# Patient Record
Sex: Female | Born: 1960 | Race: White | Hispanic: No | Smoking: Never smoker
Health system: Southern US, Community
[De-identification: ages and names within clinical notes are randomized; demographics above are authoritative.]

## PROBLEM LIST (undated history)

## (undated) DIAGNOSIS — M1990 Unspecified osteoarthritis, unspecified site: Secondary | ICD-10-CM

## (undated) DIAGNOSIS — K529 Noninfective gastroenteritis and colitis, unspecified: Secondary | ICD-10-CM

## (undated) DIAGNOSIS — K2960 Other gastritis without bleeding: Secondary | ICD-10-CM

## (undated) DIAGNOSIS — K219 Gastro-esophageal reflux disease without esophagitis: Secondary | ICD-10-CM

## (undated) DIAGNOSIS — R112 Nausea with vomiting, unspecified: Secondary | ICD-10-CM

## (undated) DIAGNOSIS — K921 Melena: Secondary | ICD-10-CM

## (undated) DIAGNOSIS — K922 Gastrointestinal hemorrhage, unspecified: Secondary | ICD-10-CM

## (undated) DIAGNOSIS — C4491 Basal cell carcinoma of skin, unspecified: Secondary | ICD-10-CM

## (undated) DIAGNOSIS — IMO0002 Reserved for concepts with insufficient information to code with codable children: Secondary | ICD-10-CM

## (undated) DIAGNOSIS — K625 Hemorrhage of anus and rectum: Secondary | ICD-10-CM

## (undated) DIAGNOSIS — Z8489 Family history of other specified conditions: Secondary | ICD-10-CM

## (undated) DIAGNOSIS — Z9889 Other specified postprocedural states: Secondary | ICD-10-CM

## (undated) HISTORY — PX: MOHS SURGERY: SUR867

## (undated) HISTORY — PX: FRACTURE SURGERY: SHX138

## (undated) HISTORY — PX: OTHER SURGICAL HISTORY: SHX169

## (undated) HISTORY — DX: Gastrointestinal hemorrhage, unspecified: K92.2

## (undated) HISTORY — PX: ESOPHAGOGASTRODUODENOSCOPY: SHX1529

## (undated) HISTORY — PX: COLONOSCOPY: SHX174

## (undated) HISTORY — PX: BACK SURGERY: SHX140

## (undated) HISTORY — DX: Gastro-esophageal reflux disease without esophagitis: K21.9

## (undated) HISTORY — PX: KNEE ARTHROSCOPY: SHX127

## (undated) HISTORY — PX: UPPER GASTROINTESTINAL ENDOSCOPY: SHX188

---
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
## 1998-01-30 ENCOUNTER — Emergency Department (HOSPITAL_COMMUNITY): Admission: EM | Admit: 1998-01-30 | Discharge: 1998-01-30 | Payer: Self-pay | Admitting: Emergency Medicine

## 1999-01-15 ENCOUNTER — Encounter: Payer: Self-pay | Admitting: Family Medicine

## 1999-01-15 ENCOUNTER — Encounter: Admission: RE | Admit: 1999-01-15 | Discharge: 1999-01-15 | Payer: Self-pay | Admitting: Family Medicine

## 1999-11-22 ENCOUNTER — Ambulatory Visit (HOSPITAL_COMMUNITY): Admission: RE | Admit: 1999-11-22 | Discharge: 1999-11-22 | Payer: Self-pay | Admitting: Sports Medicine

## 1999-11-26 ENCOUNTER — Emergency Department (HOSPITAL_COMMUNITY): Admission: EM | Admit: 1999-11-26 | Discharge: 1999-11-26 | Payer: Self-pay

## 2000-01-29 ENCOUNTER — Encounter: Payer: Self-pay | Admitting: Obstetrics and Gynecology

## 2000-01-29 ENCOUNTER — Inpatient Hospital Stay (HOSPITAL_COMMUNITY): Admission: AD | Admit: 2000-01-29 | Discharge: 2000-01-29 | Payer: Self-pay | Admitting: Obstetrics and Gynecology

## 2000-02-22 ENCOUNTER — Ambulatory Visit: Admission: RE | Admit: 2000-02-22 | Discharge: 2000-02-22 | Payer: Self-pay | Admitting: Gynecologic Oncology

## 2001-10-31 HISTORY — PX: AUGMENTATION MAMMAPLASTY: SUR837

## 2002-01-31 HISTORY — PX: CLAVICLE SURGERY: SHX598

## 2002-09-22 ENCOUNTER — Emergency Department (HOSPITAL_COMMUNITY): Admission: EM | Admit: 2002-09-22 | Discharge: 2002-09-22 | Payer: Self-pay | Admitting: Emergency Medicine

## 2002-09-22 ENCOUNTER — Encounter: Payer: Self-pay | Admitting: Emergency Medicine

## 2003-03-07 ENCOUNTER — Encounter: Admission: RE | Admit: 2003-03-07 | Discharge: 2003-03-07 | Payer: Self-pay | Admitting: Family Medicine

## 2003-10-24 ENCOUNTER — Encounter: Admission: RE | Admit: 2003-10-24 | Discharge: 2003-10-24 | Payer: Self-pay | Admitting: Family Medicine

## 2005-12-31 HISTORY — PX: ANTERIOR CERVICAL DECOMP/DISCECTOMY FUSION: SHX1161

## 2006-01-25 ENCOUNTER — Ambulatory Visit (HOSPITAL_COMMUNITY): Admission: RE | Admit: 2006-01-25 | Discharge: 2006-01-26 | Payer: Self-pay | Admitting: Neurosurgery

## 2006-10-05 ENCOUNTER — Encounter: Admission: RE | Admit: 2006-10-05 | Discharge: 2006-10-05 | Payer: Self-pay | Admitting: Neurosurgery

## 2007-06-11 ENCOUNTER — Encounter: Payer: Self-pay | Admitting: Internal Medicine

## 2007-06-11 ENCOUNTER — Ambulatory Visit: Payer: Self-pay | Admitting: Internal Medicine

## 2007-06-11 DIAGNOSIS — IMO0001 Reserved for inherently not codable concepts without codable children: Secondary | ICD-10-CM | POA: Insufficient documentation

## 2007-06-11 DIAGNOSIS — R030 Elevated blood-pressure reading, without diagnosis of hypertension: Secondary | ICD-10-CM

## 2007-06-11 DIAGNOSIS — K219 Gastro-esophageal reflux disease without esophagitis: Secondary | ICD-10-CM | POA: Insufficient documentation

## 2007-06-29 ENCOUNTER — Ambulatory Visit: Payer: Self-pay | Admitting: Gastroenterology

## 2007-07-06 ENCOUNTER — Encounter: Admission: RE | Admit: 2007-07-06 | Discharge: 2007-07-06 | Payer: Self-pay | Admitting: Obstetrics & Gynecology

## 2007-07-16 ENCOUNTER — Ambulatory Visit: Payer: Self-pay | Admitting: Gastroenterology

## 2007-08-09 ENCOUNTER — Ambulatory Visit: Payer: Self-pay | Admitting: Internal Medicine

## 2007-08-20 ENCOUNTER — Ambulatory Visit: Payer: Self-pay | Admitting: Gastroenterology

## 2007-08-27 ENCOUNTER — Encounter: Payer: Self-pay | Admitting: Internal Medicine

## 2007-09-07 ENCOUNTER — Ambulatory Visit: Payer: Self-pay | Admitting: Pulmonary Disease

## 2007-09-10 ENCOUNTER — Telehealth (INDEPENDENT_AMBULATORY_CARE_PROVIDER_SITE_OTHER): Payer: Self-pay | Admitting: *Deleted

## 2007-09-12 ENCOUNTER — Encounter: Payer: Self-pay | Admitting: Pulmonary Disease

## 2007-09-24 ENCOUNTER — Ambulatory Visit: Payer: Self-pay | Admitting: Gastroenterology

## 2007-10-02 LAB — HM PAP SMEAR

## 2007-10-02 LAB — CONVERTED CEMR LAB: Pap Smear: NORMAL

## 2007-10-05 ENCOUNTER — Ambulatory Visit: Payer: Self-pay | Admitting: Internal Medicine

## 2007-10-05 LAB — CONVERTED CEMR LAB
Albumin: 4.3 g/dL (ref 3.5–5.2)
Alkaline Phosphatase: 48 units/L (ref 39–117)
BUN: 15 mg/dL (ref 6–23)
Bilirubin Urine: NEGATIVE
Calcium: 9.2 mg/dL (ref 8.4–10.5)
Cholesterol: 209 mg/dL (ref 0–200)
Creatinine, Ser: 0.8 mg/dL (ref 0.4–1.2)
Direct LDL: 113.5 mg/dL
Eosinophils Absolute: 0.1 10*3/uL (ref 0.0–0.7)
Eosinophils Relative: 1.4 % (ref 0.0–5.0)
GFR calc Af Amer: 99 mL/min
GFR calc non Af Amer: 82 mL/min
Glucose, Urine, Semiquant: NEGATIVE
HCT: 38 % (ref 36.0–46.0)
Ketones, urine, test strip: NEGATIVE
MCV: 99.8 fL (ref 78.0–100.0)
Monocytes Absolute: 0.3 10*3/uL (ref 0.1–1.0)
Platelets: 232 10*3/uL (ref 150–400)
Potassium: 4 meq/L (ref 3.5–5.1)
RDW: 12.1 % (ref 11.5–14.6)
Specific Gravity, Urine: 1.025
TSH: 1.19 microintl units/mL (ref 0.35–5.50)
Triglycerides: 49 mg/dL (ref 0–149)
WBC: 4.7 10*3/uL (ref 4.5–10.5)

## 2007-10-11 ENCOUNTER — Ambulatory Visit (HOSPITAL_COMMUNITY): Admission: RE | Admit: 2007-10-11 | Discharge: 2007-10-11 | Payer: Self-pay | Admitting: Obstetrics & Gynecology

## 2007-10-11 ENCOUNTER — Encounter (INDEPENDENT_AMBULATORY_CARE_PROVIDER_SITE_OTHER): Payer: Self-pay | Admitting: Obstetrics & Gynecology

## 2007-10-19 ENCOUNTER — Ambulatory Visit: Payer: Self-pay | Admitting: Internal Medicine

## 2007-11-15 ENCOUNTER — Encounter: Payer: Self-pay | Admitting: Internal Medicine

## 2008-02-11 ENCOUNTER — Ambulatory Visit: Payer: Self-pay | Admitting: Internal Medicine

## 2008-02-19 ENCOUNTER — Encounter: Payer: Self-pay | Admitting: Internal Medicine

## 2008-02-21 ENCOUNTER — Encounter: Admission: RE | Admit: 2008-02-21 | Discharge: 2008-02-21 | Payer: Self-pay | Admitting: Internal Medicine

## 2008-02-28 ENCOUNTER — Encounter: Payer: Self-pay | Admitting: Internal Medicine

## 2008-02-29 ENCOUNTER — Ambulatory Visit: Payer: Self-pay | Admitting: Internal Medicine

## 2008-02-29 ENCOUNTER — Encounter: Payer: Self-pay | Admitting: Pulmonary Disease

## 2008-07-10 ENCOUNTER — Encounter: Admission: RE | Admit: 2008-07-10 | Discharge: 2008-07-10 | Payer: Self-pay | Admitting: Obstetrics & Gynecology

## 2008-11-21 ENCOUNTER — Ambulatory Visit: Payer: Self-pay | Admitting: Gastroenterology

## 2008-11-24 ENCOUNTER — Encounter (INDEPENDENT_AMBULATORY_CARE_PROVIDER_SITE_OTHER): Payer: Self-pay | Admitting: *Deleted

## 2008-11-24 ENCOUNTER — Telehealth (INDEPENDENT_AMBULATORY_CARE_PROVIDER_SITE_OTHER): Payer: Self-pay | Admitting: *Deleted

## 2008-11-25 ENCOUNTER — Encounter: Payer: Self-pay | Admitting: Gastroenterology

## 2008-12-05 ENCOUNTER — Ambulatory Visit: Payer: Self-pay | Admitting: Internal Medicine

## 2008-12-05 LAB — CONVERTED CEMR LAB
ALT: 20 units/L (ref 0–35)
Albumin: 4.3 g/dL (ref 3.5–5.2)
Alkaline Phosphatase: 53 units/L (ref 39–117)
Basophils Relative: 1.2 % (ref 0.0–3.0)
Bilirubin, Direct: 0.1 mg/dL (ref 0.0–0.3)
CO2: 30 meq/L (ref 19–32)
Chloride: 105 meq/L (ref 96–112)
Eosinophils Absolute: 0.1 10*3/uL (ref 0.0–0.7)
Eosinophils Relative: 2.2 % (ref 0.0–5.0)
Hemoglobin: 12.9 g/dL (ref 12.0–15.0)
LDL Cholesterol: 97 mg/dL (ref 0–99)
Lymphocytes Relative: 32.6 % (ref 12.0–46.0)
MCHC: 34.1 g/dL (ref 30.0–36.0)
MCV: 102.8 fL — ABNORMAL HIGH (ref 78.0–100.0)
Monocytes Absolute: 0.2 10*3/uL (ref 0.1–1.0)
Neutro Abs: 2.2 10*3/uL (ref 1.4–7.7)
Nitrite: NEGATIVE
RBC: 3.68 M/uL — ABNORMAL LOW (ref 3.87–5.11)
Sodium: 143 meq/L (ref 135–145)
Specific Gravity, Urine: 1.015
Total CHOL/HDL Ratio: 3
Total Protein: 6.6 g/dL (ref 6.0–8.3)
WBC Urine, dipstick: NEGATIVE
WBC: 3.7 10*3/uL — ABNORMAL LOW (ref 4.5–10.5)

## 2008-12-30 ENCOUNTER — Ambulatory Visit: Payer: Self-pay | Admitting: Internal Medicine

## 2008-12-30 DIAGNOSIS — D539 Nutritional anemia, unspecified: Secondary | ICD-10-CM | POA: Insufficient documentation

## 2009-01-02 LAB — CONVERTED CEMR LAB: Vitamin B-12: 448 pg/mL (ref 211–911)

## 2009-01-31 HISTORY — PX: BRAVO PH STUDY: SHX5421

## 2009-02-05 ENCOUNTER — Ambulatory Visit: Payer: Self-pay | Admitting: Gastroenterology

## 2009-02-05 ENCOUNTER — Ambulatory Visit (HOSPITAL_COMMUNITY): Admission: RE | Admit: 2009-02-05 | Discharge: 2009-02-05 | Payer: Self-pay | Admitting: Gastroenterology

## 2009-02-09 ENCOUNTER — Telehealth: Payer: Self-pay | Admitting: Gastroenterology

## 2009-02-13 ENCOUNTER — Encounter: Payer: Self-pay | Admitting: Gastroenterology

## 2009-02-18 ENCOUNTER — Telehealth: Payer: Self-pay | Admitting: Gastroenterology

## 2009-03-12 ENCOUNTER — Encounter: Payer: Self-pay | Admitting: Gastroenterology

## 2009-05-28 ENCOUNTER — Encounter: Payer: Self-pay | Admitting: Gastroenterology

## 2009-05-28 ENCOUNTER — Encounter: Payer: Self-pay | Admitting: Internal Medicine

## 2009-05-31 HISTORY — PX: 24 HOUR PH STUDY: SHX5419

## 2009-05-31 HISTORY — PX: ESOPHAGEAL MANOMETRY: SHX1526

## 2009-06-22 ENCOUNTER — Encounter: Payer: Self-pay | Admitting: Gastroenterology

## 2009-07-20 ENCOUNTER — Telehealth: Payer: Self-pay | Admitting: Gastroenterology

## 2009-07-23 ENCOUNTER — Ambulatory Visit: Payer: Self-pay | Admitting: Family Medicine

## 2009-07-24 ENCOUNTER — Ambulatory Visit: Payer: Self-pay | Admitting: Gastroenterology

## 2009-07-31 LAB — HM MAMMOGRAPHY: HM Mammogram: NORMAL

## 2009-08-13 ENCOUNTER — Encounter: Admission: RE | Admit: 2009-08-13 | Discharge: 2009-08-13 | Payer: Self-pay | Admitting: Obstetrics & Gynecology

## 2009-08-14 ENCOUNTER — Ambulatory Visit: Payer: Self-pay | Admitting: Internal Medicine

## 2009-12-18 ENCOUNTER — Ambulatory Visit: Payer: Self-pay | Admitting: Internal Medicine

## 2009-12-18 LAB — CONVERTED CEMR LAB
ALT: 19 units/L (ref 0–35)
Alkaline Phosphatase: 39 units/L (ref 39–117)
BUN: 15 mg/dL (ref 6–23)
Basophils Relative: 1.1 % (ref 0.0–3.0)
Bilirubin Urine: NEGATIVE
Bilirubin, Direct: 0.1 mg/dL (ref 0.0–0.3)
Calcium: 9.2 mg/dL (ref 8.4–10.5)
Cholesterol: 198 mg/dL (ref 0–200)
Creatinine, Ser: 0.8 mg/dL (ref 0.4–1.2)
Eosinophils Relative: 2.3 % (ref 0.0–5.0)
GFR calc non Af Amer: 83.02 mL/min (ref >60)
HDL: 65.4 mg/dL (ref >39.00)
Ketones, urine, test strip: NEGATIVE
LDL Cholesterol: 110 mg/dL — ABNORMAL HIGH (ref 0–99)
Lymphocytes Relative: 33.6 % (ref 12.0–46.0)
MCV: 100 fL (ref 78.0–100.0)
Monocytes Absolute: 0.3 10*3/uL (ref 0.1–1.0)
Neutrophils Relative %: 54.9 % (ref 43.0–77.0)
Nitrite: NEGATIVE
Platelets: 213 10*3/uL (ref 150.0–400.0)
Protein, U semiquant: NEGATIVE
RBC: 3.77 M/uL — ABNORMAL LOW (ref 3.87–5.11)
Total Bilirubin: 0.6 mg/dL (ref 0.3–1.2)
Total CHOL/HDL Ratio: 3
Total Protein: 6.9 g/dL (ref 6.0–8.3)
Triglycerides: 111 mg/dL (ref 0.0–149.0)
Urobilinogen, UA: 0.2
VLDL: 22.2 mg/dL (ref 0.0–40.0)
WBC: 3.2 10*3/uL — ABNORMAL LOW (ref 4.5–10.5)

## 2010-01-01 ENCOUNTER — Ambulatory Visit: Payer: Self-pay | Admitting: Internal Medicine

## 2010-01-16 ENCOUNTER — Encounter: Payer: Self-pay | Admitting: *Deleted

## 2010-01-26 ENCOUNTER — Encounter (INDEPENDENT_AMBULATORY_CARE_PROVIDER_SITE_OTHER): Payer: Self-pay | Admitting: *Deleted

## 2010-02-20 ENCOUNTER — Encounter: Payer: Self-pay | Admitting: Family Medicine

## 2010-03-02 ENCOUNTER — Other Ambulatory Visit: Payer: Self-pay | Admitting: Dermatology

## 2010-03-02 NOTE — Assessment & Plan Note (Signed)
Summary: FU PER DR STAFFORD/NJR   Vital Signs:  Patient profile:   50 year old female Menstrual status:  regular Height:      66.5 inches (168.91 cm) Weight:      155.31 pounds (70.60 kg) Temp:     99.2 degrees F (37.33 degrees C) oral Pulse rate:   78 / minute BP sitting:   136 / 90  (left arm) Cuff size:   regular  Vitals Entered By: Josph Macho RMA (August 14, 2009 8:46 AM) CC: Follow-up visit per Dr Scotty Court CF Is Patient Diabetic? No   Primary Care Provider:  Birdie Sons, MD  CC:  Follow-up visit per Dr Scotty Court CF.  History of Present Illness: Reviewed Dr.  Charmian Muff note she did not take the benicar very often BPs  have now normalized of of all BP meds When bp was elevated she felt generally unwell: fatigue headach she now feels well  All other systems reviewed and were negative   Current Medications (verified): 1)  Zegerid 40-1100 Mg  Caps (Omeprazole-Sodium Bicarbonate) .... One A Day Before Meals 2)  Viactiv Multi-Vitamin   Chew (Multiple Vitamins-Calcium) .... Two Times A Day 3)  Junel Fe 1/20 1-20 Mg-Mcg Tabs (Norethin Ace-Eth Estrad-Fe)  Allergies (verified): 1)  ! * Narcotics  Physical Exam  General:  Well-developed,well-nourished,in no acute distress; alert,appropriate and cooperative throughout examination Neck:  no JVD, thyromegaly, or lymphadenopathy. Lungs:  Normal respiratory effort, chest expands symmetrically. Lungs are clear to auscultation, no crackles or wheezes. Heart:  Normal rate and regular rhythm. S1 and S2 normal without gallop, murmur, click, rub or other extra sounds.   Impression & Recommendations:  Problem # 1:  HYPERTENSION (ICD-401.9) resolved no further evaluation The following medications were removed from the medication list:    Benicar 20 Mg Tabs (Olmesartan medoxomil) .Marland Kitchen... 1 stat then one q.d. cough---she will try claritin  Complete Medication List: 1)  Zegerid 40-1100 Mg Caps (Omeprazole-sodium bicarbonate)  .... One a day before meals 2)  Viactiv Multi-vitamin Chew (Multiple vitamins-calcium) .... Two times a day 3)  Junel Fe 1/20 1-20 Mg-mcg Tabs (Norethin ace-eth estrad-fe)

## 2010-03-02 NOTE — Procedures (Signed)
Summary: Esophageal Manometry, Esophageal pH Study/[facility]  Esophageal Manometry, Esophageal pH Study/[facility]   Imported By: Maryln Gottron 06/23/2009 10:21:51  _____________________________________________________________________  External Attachment:    Type:   Image     Comment:   External Document

## 2010-03-02 NOTE — Procedures (Signed)
Summary: BRAVO pH/[facility]  BRAVO pH/ Gastroenterology   Imported By: Lester Patillas 03/06/2009 09:52:22  _____________________________________________________________________  External Attachment:    Type:   Image     Comment:   External Document

## 2010-03-02 NOTE — Miscellaneous (Signed)
Summary: Orders Update/Dr Alycia Rossetti  Clinical Lists Changes  Orders: Added new Test order of [facility]) - Signed

## 2010-03-02 NOTE — Procedures (Signed)
Summary: EGD   EGD  Procedure date:  07/16/2007  Findings:      Location: [facility]    EGD  Procedure date:  07/16/2007  Findings:      Location: [facility]   Patient Name: Jacqueline Molina, Jacqueline Molina MRN:  Procedure Procedures: Panendoscopy (EGD) CPT: 43235.    with biopsy(s)/brushing(s). CPT: D1846139.  Personnel: Endoscopist: Rachael Fee, MD.  Referred By: Valetta Mole Swords, MD.  Exam Location: Exam performed in Endoscopy Suite. Outpatient  Patient Consent: Procedure, Alternatives, Risks and Benefits discussed, consent obtained, from patient. Consent was obtained by the RN.  Indications Symptoms: Dysphagia. COUGH FOR 10 YEARS, NO CLASSIC GERD SYMPTOMS (PYROSIS, ACID REGURGE), +INTERMITTENT SOLID FOOD DYSPHAGIA (RECENTLY), TAKES BID PREVACID, TAKES MULITPLE COUGH DROPS DAILY.  HAS NEVER SEEN PULMONOLOGIST.  History  Current Medications: Patient is not currently taking Coumadin.  Comments: Patient history reviewed and updated, pre-procedure physical performed prior to initiation of sedation? YES Pre-Exam Physical: Performed Jul 16, 2007  Cardio-pulmonary exam, Abdominal exam, Mental status exam WNL.  Exam Exam Info: Maximum depth of insertion Duodenum, intended Duodenum. Patient position: on left side. Gastric retroflexion performed. Images taken. ASA Classification: II. Tolerance: good.  Sedation Meds: Patient assessed and found to be appropriate for moderate (conscious) sedation. Fentanyl 100 mcg. given IV. Versed 9 mg. given IV.  Monitoring: BP and pulse monitoring done. Oximetry used. Supplemental O2 given  Findings - Normal: Proximal Esophagus to Duodenal 2nd Portion. Comments: OTHERWISE NORMAL EXAMINATION.  - MUCOSAL ABNORMALITY: Antrum. RUT done, results pending. Comment: SEVERAL SMALL GASTRIC EROSIONS IN DISTAL STOMACH, MOST CONSISTENT WITH NSAID CHANGES.  CLO BIOPSIES TAKEN TO CHECK FOR H. PYLORI.   Assessment Abnormal  examination, see findings above.  Comments: DISTAL GASTRIC EROSIONS, MOST CONSISTENT WITH NSAID DAMAGE.  CLO TESTING PERFORMED AND IF POSITIVE WILL ERADICATE H. PYLORI WITH ANTIBIOTICS.  THESE FINDINGS DO NOT EXPLAIN HER SYMPTOMS.  SHE HAS NO BARRETT'S, ESOPHAGITIS, PEPTIC STRICTURING IN ESOPHAGUS.  SHE HAS NO CLASSIC GERD SYMPTOMS (PYROSIS, ACID REGURGITATION, WATERBRASH) AND SO IT IS NOT CLEAR THAT GERD IS CAUSING HER CHRONIC COUGH.   SHE WILL BE SCHEDULED FOR AN OFFICE VISIT WITH ME IN THE NEXT 2-3 WEEKS TO DISCUSS HER CHRONIC SYMPTOMS AND CONSIDER FURTHER WORKUP.  Events  Unplanned Intervention: No unplanned interventions were required.  Unplanned Events: There were no complications. Plans Comments: NEEDS NEW GI OFFICE VISIT WITH ME IN NEXT 2-3 WEEKS.

## 2010-03-02 NOTE — Procedures (Signed)
Summary: Esophageal [facility]  Esophageal [facility]   Imported By: Sherian Rein 06/18/2009 13:34:42  _____________________________________________________________________  External Attachment:    Type:   Image     Comment:   External Document

## 2010-03-02 NOTE — Progress Notes (Signed)
Summary: triage  Phone Note Call from Patient Call back at Work Phone [phone] Call back at [phone]  (cell)   Caller: Patient Call For: Dr. Christella Hartigan Reason for Call: Talk to Nurse Summary of Call: pt reporting a tightness in her chest (between her breasts specifically) after swallowing food... actual act of swallowing is without difficulty... wanted to speak with a nurse/CMA before schl'ing Initial call taken by: Vallarie Mare,  February 09, 2009 10:19 AM  Follow-up for Phone Call        left message on machine to call back  Follow-up by: Chales Abrahams CMA Duncan Dull),  February 09, 2009 10:55 AM  Additional Follow-up for Phone Call Additional follow up Details #1::        pt has pain when swallowing food only.  After the food passes the pain goes away.  She is chewing her food really well. She had Bravo placed on Thursday.  This pain started on Sat.  She has a tight feeling even when not eating or drinking.  Even liquids cause some pain.  No trouble breathing.  Dr Christella Hartigan is off today I will send this to Dr Leone Payor who is Doc of the day.  Please Advise Dr Leone Payor. Chales Abrahams CMA Duncan Dull)  February 09, 2009 11:01 AM     Additional Follow-up for Phone Call Additional follow up Details #2::    Restart Zegerid two times a day liquid to soft diet, ovoid very hot, very coldm, spicy, citrus/acidic foods call back if fever, vomiting blood, worse expect this to improve and resolve over next few days and can advance diet as toleraated merge charts as you plan to and cc Dr. Christella Hartigan also Follow-up by: Iva Boop MD, Clementeen Graham,  February 09, 2009 11:17 AM  Additional Follow-up for Phone Call Additional follow up Details #3:: Details for Additional Follow-up Action Taken: Pt given Dr Marvell Fuller recommendations and told to call us on Friday for a condition update. Additional Follow-up by: Chales Abrahams CMA Duncan Dull),  February 09, 2009 11:36 AM   Appended Document: triage ok  Appended Document: triage she  is feeling better, nearly back to normal.  i suspect she was feeling the bravo probe or irritation in esophagus where probe had attatched.  either way she is better.   her pH test showed elevated acid levels (demeester was above normal) and also very good correlation between her coughing, belching and acid reflux events.  She notices small improvement on twice daily PPI.  I wonder about a component of non-acid reflux also causing her symptoms.  I think she MAY benefit from fundoplication but would like to get another opinion on this from Dr. Claire Shown at [facility].   Alexia Freestone can you please arrange referral to Dr. Alycia Rossetti, ? is would she benefit from fundoplication in his opinion.  Send copies of EGD, recent office visit, also the Bravo tracings (on my desk).  thanks  Appended Document: triage records faxed to Dr Alycia Rossetti for appt

## 2010-03-02 NOTE — Assessment & Plan Note (Signed)
  Review of gastrointestinal problems: 1. Chronic cough.  ? GERD related.  Very rarely has classic GERD symptoms. EGD 07/2007 showed normal esophagus, minor H. pylori negative gastritis.  ENT eval suggested LE reflux, symptoms improved with zegeride.  Pulmonary evaluation suggests no asthma, intrinsic pulmonary causes for cough.  2011 evaluation at [facility] gastroenterology Department including impedance testing concluded that her cough is not likely related to gastroesophageal reflux disease.   History of Present Illness Visit Type: Follow-up Visit Primary GI MD: Rob Bunting MD Primary Provider: Birdie Sons, MD Chief Complaint: [facility] f/u History of Present Illness:     very pleasant 50 year old woman who [facility]after her [facility] at [facility] gastroenterology Department for chronic cough.  Extensive testing was done there including repeating pH study, impedance testing. There conclusion after all the tests was that her cough was would not related to gastroesophageal reflux.  She is very clear today and has always been clear that stressful situations tend to bring on the cough.  yesterday at work, her BP was quite high (160/110), and was put on BP med benicar.  she has not had any cough since then.  She has been very "drugged feeling, woozy"             Current Medications (verified): 1)  Zegerid 40-1100 Mg  Caps (Omeprazole-Sodium Bicarbonate) .... Two Times A Day Before Meals 2)  Viactiv Multi-Vitamin   Chew (Multiple Vitamins-Calcium) .... Two Times A Day 3)  Benicar 20 Mg Tabs (Olmesartan Medoxomil) .Marland Kitchen.. 1 Stat Then One Q.d. 4)  Junel Birth Control Pill .... As Directed  Allergies (verified): 1)  ! * Narcotics  Vital Signs:  Patient profile:   50 year old female Menstrual status:  regular Height:      66.5 inches Weight:      157 pounds BMI:     25.05 Pulse rate:   96 / minute Pulse rhythm:   regular BP sitting:   158 / 86  (left  arm)  Vitals Entered By: Chales Abrahams CMA Duncan Dull) (July 24, 2009 10:51 AM)  Physical Exam  Additional Exam:  Constitutional: generally well appearing Psychiatric: alert and oriented times 3 Abdomen: soft, non-tender, non-distended, normal bowel sounds    Impression & Recommendations:  Problem # 1:  Cough it is not seen this is GI related. I recommended she followup again with her primary care physician to discuss any further referrals. Stress seems to bring on most reliably. I suggested to her that she consider antianxiety medicine however she is not very excited about that, she is afraid she'll be too drowsy from even small dose.  Patient Instructions: 1)  Follow up with Dr. Cato Mulligan to consider other referral for cough (allergist?). 2)  Several tests (in  and at [facility]) point away from acid playing a signficant role in your cough. 3)  Start decreasing you PPI (zegeride) to once a day for 1-2 months, then try off completley. 4)  The medication list was reviewed and reconciled.  All changed / newly prescribed medications were explained.  A complete medication list was provided to the patient / caregiver.

## 2010-03-02 NOTE — Procedures (Signed)
Summary: Esophageal Function test/[facility]  Esophageal Function test/[facility]   Imported By: Maryln Gottron 06/23/2009 10:37:38  _____________________________________________________________________  External Attachment:    Type:   Image     Comment:   External Document

## 2010-03-02 NOTE — Progress Notes (Signed)
Summary: Next step?  Phone Note Call from Patient Call back at Work Phone [phone]   Call For: DR Christella Hartigan Reason for Call: Talk to Nurse Summary of Call: Had test done at [facility] and is looking for what is the next step.  Initial call taken by: Leanor Kail [facility],  July 20, 2009 12:51 PM  Follow-up for Phone Call        rov with me in next few weeks to discuss Follow-up by: Rachael Fee MD,  July 21, 2009 7:58 AM  Additional Follow-up for Phone Call Additional follow up Details #1::        pt aware appt made Additional Follow-up by: Chales Abrahams CMA Duncan Dull),  July 22, 2009 8:42 AM

## 2010-03-02 NOTE — Procedures (Signed)
Summary: Esophageal pH Study[facility]  Esophageal pH Study/[facility]   Imported By: Sherian Rein 06/18/2009 13:33:36  _____________________________________________________________________  External Attachment:    Type:   Image     Comment:   External Document

## 2010-03-02 NOTE — Procedures (Signed)
Summary: Esophageal Manometry/[facility]  [facility]   Imported By: Sherian Rein 06/30/2009 09:17:18  _____________________________________________________________________  External Attachment:    Type:   Image     Comment:   External Document

## 2010-03-02 NOTE — Procedures (Signed)
Summary: Upper Endoscopy  Patient: Jacqueline Molina Note: All result statuses are Final unless otherwise noted.  Tests: (1) Upper Endoscopy (EGD)   EGD Upper Endoscopy       DONE     [facility]     [address] [place], [place]  [address]           ENDOSCOPY PROCEDURE REPORT           PATIENT:  [name], [name]  MR#:  [id number]     BIRTHDATE:  [birth year], 48 yrs. old  GENDER:  female     ENDOSCOPIST:  Rachael Fee, MD     PROCEDURE DATE:  02/05/2009     PROCEDURE:  Bravo pH probe placement     ASA CLASS:  Class II     INDICATIONS:  chronic cough, ? GERD related. This pH test is being     done OFF antiacid medicines.     MEDICATIONS:  Fentanyl 70 mcg IV, Versed 7 mg IV     TOPICAL ANESTHETIC:  none     DESCRIPTION OF PROCEDURE:   After the risks benefits and     alternatives of the procedure were thoroughly explained, informed     consent was obtained.  The  endoscope was introduced through the     mouth and advanced to the second portion of the duodenum, without     limitations.  The instrument was slowly withdrawn as the mucosa     was fully examined.     <<PROCEDUREIMAGES>>     The upper, middle, and distal third of the esophagus were     carefully inspected and no abnormalities were noted. The z-line     was well seen at the GEJ. The endoscope was pushed into the fundus     which was normal including a retroflexed view. The antrum,gastric     body, first and second part of the duodenum were unremarkable. GE     junction located at 43cm from incisors. Bravo pH probe was placed     in usual fashion at 37cm from incisors and position was verified     endoscopicall following placement (see image1, image2, image3,     image5, and image7).    Retroflexed views revealed no     abnormalities.    The scope was then withdrawn from the patient     and the procedure completed.     COMPLICATIONS:  None           ENDOSCOPIC IMPRESSION:     1) Normal EGD.  Bravo pH  probe placed 6cm above GE junction.           RECOMMENDATIONS:     Await Bravo pH testing results.  She has noticed that her cough     has probably been worse since stopping Zegeride one week ago for     this test.  Perhaps evidence that acid is playing a role.  She can     resume the Zegeride AFTER the test is completed.           ______________________________     Rachael Fee, MD           cc: Birdie Sons, MD; Marcelyn Bruins, MD           n.     eSIGNED:   Rachael Fee at 02/05/2009 08:33 AM           Leticia Penna, [id number]  Note: An exclamation mark (!) indicates a result that was not dispersed into the flowsheet. Document Creation Date: 02/05/2009 8:33 AM _______________________________________________________________________  (1) Order result status: Final Collection or observation date-time: 02/05/2009 08:27 Requested date-time:  Receipt date-time:  Reported date-time:  Referring Physician:   Ordering Physician: Rob Bunting [phone]) Specimen Source:  Source: Launa Grill Order Number: [id number] Lab site:

## 2010-03-02 NOTE — Assessment & Plan Note (Signed)
Summary: cpx/no pap/cjr   Vital Signs:  Patient profile:   50 year old female Menstrual status:  regular Height:      66.5 inches Weight:      160 pounds Temp:     99.2 degrees F oral Pulse rate:   100 / minute Pulse rhythm:   regular BP sitting:   144 / 94  (left arm) Cuff size:   regular  Vitals Entered By: Alfred Levins, CMA (January 01, 2010 8:13 AM) CC: cpx, no pap   CC:  cpx and no pap.  Allergies: 1)  ! * Narcotics  Physical Exam  General:  well-developed well-nourished female in no acute distress. HEENT exam atraumatic, normocephalic symmetric her muscles are intact. Neck is supple.she is an anterior cervical scar. Chest clear to auscultation without increased work of breathing. Cardiac exam S1-S2 are regular without murmurs gals her abdominal exam active bowel sounds, soft and nontender. Extremities no clubbing cyanosis or edema. Neurologic exam is alert and oriented without any motor or sensory deficits.   Impression & Recommendations:  Problem # 1:  PREVENTIVE HEALTH CARE (ICD-V70.0) she's in excellent health. She exercises regularly. I've asked her to monitor her blood pressure at home. We'll schedule her for colonoscopy. Immunizations are up-to-date. Orders: Gastroenterology Referral (GI)  Complete Medication List: 1)  Zegerid 40-1100 Mg Caps (Omeprazole-sodium bicarbonate) .... One a day before meals 2)  Viactiv Multi-vitamin Chew (Multiple vitamins-calcium) .... Two times a day 3)  Camila 0.35 Mg Tabs (Norethindrone) .Marland Kitchen.. 1 by mouth once daily  Preventive Care Screening  Mammogram:    Date:  07/31/2009    Next Due:  08/2010    Results:  normal   Prescriptions: ZEGERID 40-1100 MG  CAPS (OMEPRAZOLE-SODIUM BICARBONATE) one a day before meals  #90 x 3   Entered and Authorized by:   Birdie Sons MD   Signed by:   Birdie Sons MD on 01/01/2010   Method used:   Electronically to        [facility] [phone]* (retail)       [address]  [place], [place]  [address]       Ph: [phone] or [phone]       Fax: [phone]   RxID:   [id number] ZEGERID 40-1100 MG  CAPS (OMEPRAZOLE-SODIUM BICARBONATE) one a day before meals  #30 x 11   Entered by:   Alfred Levins, CMA   Authorized by:   Birdie Sons MD   Signed by:   Alfred Levins, CMA on 01/01/2010   Method used:   Electronically to        [facility] [phone][facility] (retail)       [address]       [place], [place]  [address]       Ph: [phone] or [phone]       Fax: [phone]   RxID:   [id number]    Orders Added: 1)  Gastroenterology Referral [GI] 2)  Est. Patient 40-64 years [[address]]   Immunization History:  Influenza Immunization History:    Influenza:  fluvax 3+ (12/01/2009)   Immunization History:  Influenza Immunization History:    Influenza:  Fluvax 3+ (12/01/2009)  Preventive Care Screening  Mammogram:    Date:  07/31/2009    Next Due:  08/2010    Results:  normal

## 2010-03-02 NOTE — Progress Notes (Signed)
Summary: ? re appt   Phone Note Call from Patient Call back at Work Phone [phone]   Caller: Patient Call For: Christella Hartigan Reason for Call: Talk to Nurse Summary of Call: Patient wants to know the status of her appt at [facility], states that she spoke to Dr Christella Hartigan on Friday and was told that she was going to get a call this week but no one has called her yet Initial call taken by: Tawni Levy,  February 18, 2009 8:42 AM  Follow-up for Phone Call        appt  03/04/09 1 pm Dr Abelina Bachelor. Pt aware.  Marilynne Drivers will send out new pt paperwork. Follow-up by: Chales Abrahams CMA Duncan Dull),  February 18, 2009 9:06 AM

## 2010-03-02 NOTE — Procedures (Signed)
Summary: Esophageal pH Study/[facility]  Esophageal pH Study/[facility]   Imported By: Sherian Rein 06/30/2009 09:15:23  _____________________________________________________________________  External Attachment:    Type:   Image     Comment:   External Document

## 2010-03-02 NOTE — Consult Note (Signed)
Summary: GI/[facility]  GI/[facility]   Imported By: Sherian Rein 07/06/2009 14:10:25  _____________________________________________________________________  External Attachment:    Type:   Image     Comment:   External Document

## 2010-03-02 NOTE — Assessment & Plan Note (Signed)
Summary: elevated BP, OK Gina/nn   Vital Signs:  Patient profile:   50 year old female Menstrual status:  regular Weight:      159 pounds O2 Sat:      99 % Temp:     99.1 degrees F Pulse rate:   86 / minute Pulse rhythm:   regular BP sitting:   164 / 100  (left arm)  Vitals Entered By: Pura Spice, RN (July 23, 2009 4:03 PM) CC: been ck bp today and has been elevated stated headache yest.    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 .Today at work her blood pressure was taken blood pressure 154//99 149 94at 3 PM today of 169/109 at 1 PM 159/105 She has no headache today no dizziness, no edema review of systems is completely negative except for the hypertension When questioned about her muscle cycle and that she was started on birth control pills 3 months previously Patient has had considerable problem over the past 5-6 months regarding esophagitis with esophageal reflux also cause a cough,she is under the care of Dr. Gerilyn Pilgrim, has had multiple failures with PPIs  Allergies: 1)  ! * Narcotics  Past History:  Past Medical History: Last updated: 10/19/2007 GERD/gastritis (has had EGD---multiple PPI failures) Hypertension  Past Surgical History: Last updated: 06/11/2007 neck fusion C5-C6-C7  12/07 breast augmentation  2001-06-16 bone graft collarbone  Jun 17, 2002 right knee surgery for bone spur  age 50 fibroid removal  Social History: Last updated: 11-26-08 husband died from esophageal cancer 06-16-2008 1 dtr healthy, four stepdaughters Alcohol use-yes, socially on weekends. Regular exercise--yes works as a Sales executive Never smoker  Risk Factors: Smoking Status: never (12/30/2008)  Review of Systems      See HPI General:  Complains of malaise. Eyes:  Denies blurring, discharge, double  vision, eye irritation, eye pain, halos, itching, light sensitivity, red eye, vision loss-1 eye, and vision loss-both eyes. ENT:  Denies decreased hearing, difficulty swallowing, ear discharge, earache, hoarseness, nasal congestion, nosebleeds, postnasal drainage, ringing in ears, sinus pressure, and sore throat. CV:  See HPI; elevated blood pressure. Resp:  Denies chest discomfort, chest pain with inspiration, cough, coughing up blood, excessive snoring, hypersomnolence, morning headaches, pleuritic, shortness of breath, sputum productive, and wheezing. GI:  has been endoscoped several times since the first of the year for esophagitis reflux which also caused a cough. GU:  Denies abnormal vaginal bleeding, decreased libido, discharge, dysuria, genital sores, hematuria, incontinence, nocturia, urinary frequency, and urinary hesitancy. MS:  Denies joint pain, joint redness, joint swelling, loss of strength, low back pain, mid back pain, muscle aches, muscle , cramps, muscle weakness, stiffness, and thoracic pain; bone graft to left clavicle.  Physical Exam  General:  Well-developed,well-nourished,in no acute distress; alert,appropriate and cooperative throughout examination Head:  Normocephalic and atraumatic without obvious abnormalities. No apparent alopecia or balding. Eyes:  No corneal or conjunctival inflammation noted. EOMI. Perrla. Funduscopic exam benign, without hemorrhages, exudates or papilledema. Vision grossly normal. Ears:  External ear exam shows no significant lesions or deformities.  Otoscopic examination reveals clear canals, tympanic membranes are intact bilaterally without bulging, retraction, inflammation or discharge. Hearing is grossly normal bilaterally. Nose:  External nasal examination shows no deformity or inflammation. Nasal mucosa are pink and moist without lesions or exudates. Mouth:  Oral mucosa  and oropharynx without lesions or exudates.  Teeth in good repair. Lungs:   Normal respiratory effort, chest expands symmetrically. Lungs are clear to auscultation, no crackles or wheezes. Heart:  Normal rate and regular rhythm. S1 and S2 normal without gallop, murmur, click, rub or other extra sounds. Abdomen:  Bowel sounds positive,abdomen soft and non-tender without masses, organomegaly or hernias noted. Rectal:  not examined Genitalia:  not examined Msk:  operative scar on the left clavicle bone graft Extremities:  no edema Neurologic:  No cranial nerve deficits noted. Station and gait are normal. Plantar reflexes are down-going bilaterally. DTRs are symmetrical throughout. Sensory, motor and coordinative functions appear intact.   Impression & Recommendations:  Problem # 1:  HYPERTENSION (ICD-401.9) Assessment Deteriorated  Her updated medication list for this problem includes:    Benicar 20 Mg Tabs (Olmesartan medoxomil) .Marland Kitchen... 1 stat then one q.d. 2 return in one week to see Dr. Timoteo Gaul regarding further studies and to evaluate the effect of the birth control pill  Problem # 2:  COUGH, CHRONIC (ICD-786.2) Assessment: Unchanged  Problem # 3:  GERD (ICD-530.81) Assessment: Unchanged  Her updated medication list for this problem includes:    Zegerid 40-1100 Mg Caps (Omeprazole-sodium bicarbonate) .Marland Kitchen..Marland Kitchen Two times a day before meals  Complete Medication List: 1)  Zegerid 40-1100 Mg Caps (Omeprazole-sodium bicarbonate) .... Two times a day before meals 2)  Viactiv Multi-vitamin Chew (Multiple vitamins-calcium) .... Two times a day 3)  Benicar 20 Mg Tabs (Olmesartan medoxomil) .Marland Kitchen.. 1 stat then one q.d.  Patient Instructions: 1)  Said she had some elevation of blood pressure in the past but then has been controlled and with her symptoms and elevated blood pressure today on numerous readings I feel that he would be well to start Benicar 20 mg q.d. to make an appointment with Dr. Cato Mulligan 2)  In the next week for him to evaluate further studies and also to help  determine whether the birth control pills have some affect on her blood pressure 3)  Recommend no strenuous activity is until seen by Dr.Swords

## 2010-03-02 NOTE — Consult Note (Signed)
Summary: Gastroenterology/[facility]  Gastroenterology/[facility]   Imported By: Sherian Rein 04/08/2009 13:58:14  _____________________________________________________________________  External Attachment:    Type:   Image     Comment:   External Document

## 2010-03-03 ENCOUNTER — Encounter (INDEPENDENT_AMBULATORY_CARE_PROVIDER_SITE_OTHER): Payer: Self-pay | Admitting: *Deleted

## 2010-03-04 ENCOUNTER — Ambulatory Visit: Admit: 2010-03-04 | Payer: Self-pay | Admitting: Gastroenterology

## 2010-03-04 ENCOUNTER — Encounter: Payer: Self-pay | Admitting: Gastroenterology

## 2010-03-04 ENCOUNTER — Telehealth: Payer: Self-pay | Admitting: Gastroenterology

## 2010-03-04 NOTE — Letter (Signed)
Summary: Pre Visit Letter Revised  [facility]  [address] [place], [place] [address]   Phone: [phone]  Fax: [phone]        01/26/2010 MRN: [id number] [facility] [address] [place], [place]  [address]             Procedure Date:  03-19-10   Welcome to the Gastroenterology Division at [facility].    You are scheduled to see a nurse for your pre-procedure visit on 03-04-10 at 2:30p.m. on the 3rd floor at [facility], [address]  We ask that you try to arrive at our office 15 minutes prior to your appointment time to allow for check-in.  Please take a minute to review the attached form.  If you answer "Yes" to one or more of the questions on the first page, we ask that you call the person listed at your earliest opportunity.  If you answer "No" to all of the questions, please complete the rest of the form and bring it to your appointment.    Your nurse visit will consist of discussing your medical and surgical history, your immediate family medical history, and your medications.   If you are unable to list all of your medications on the form, please bring the medication bottles to your appointment and we will list them.  We will need to be aware of both prescribed and over the counter drugs.  We will need to know exact dosage information as well.    Please be prepared to read and sign documents such as consent forms, a financial agreement, and acknowledgement forms.  If necessary, and with your consent, a friend or relative is welcome to sit-in on the nurse visit with you.  Please bring your insurance card so that we may make a copy of it.  If your insurance requires a referral to see a specialist, please bring your referral form from your primary care physician.  No co-pay is required for this nurse visit.     If you cannot keep your appointment, please call [phone] to cancel or reschedule prior to your appointment date.  This  allows [place] the opportunity to schedule an appointment for another patient in need of care.    Thank you for choosing [facility] for your medical needs.  We appreciate the opportunity to care for you.  Please visit [place] at our website  to learn more about our practice.  Sincerely, The Gastroenterology Division

## 2010-03-10 NOTE — Progress Notes (Signed)
Summary: ?EGD  Phone Note Outgoing Call   Call placed by: Karl Bales RN,  March 04, 2010 3:02 PM Summary of Call: Dr. Christella Hartigan, Jacqueline Molina was in for her previsit today; she has a screening colonoscopy on 03-19-10.  She is still taking her Zegerid for her GERD but states that she notices that her voice has changed and is more "raspy."  She was wondering if you felt she needs to have an EGD as well.  Her deductible starts over in March so she wanted to double check.   Thank you, Kristen Initial call taken by: Karl Bales RN,  March 04, 2010 3:04 PM  Follow-up for Phone Call        no, her voice/cough have been extensively evaluated by me and ENT and [facility]...does not appear acid related.  would go ahead with colonoscopy alone Follow-up by: Rachael Fee MD,  March 04, 2010 3:13 PM  Additional Follow-up for Phone Call Additional follow up Details #1::        Ok, thank you very much Additional Follow-up by: Karl Bales RN,  March 04, 2010 3:51 PM    Additional Follow-up for Phone Call Additional follow up Details #2::    Pt called and told of Dr. Christella Hartigan recommendation to do colonoscopy alone. Follow-up by: Karl Bales RN,  March 04, 2010 3:51 PM

## 2010-03-10 NOTE — Miscellaneous (Signed)
Summary: [facility] previsit  Clinical Lists Changes  Medications: Added new medication of MOVIPREP 100 GM  SOLR (PEG-KCL-NACL-NASULF-NA ASC-C) As per prep instructions. - Signed Rx of MOVIPREP 100 GM  SOLR (PEG-KCL-NACL-NASULF-NA ASC-C) As per prep instructions.;  #1 x 0;  Signed;  Entered by: Karl Bales RN;  Authorized by: Rachael Fee MD;  Method used: Electron[facility] to [facility] [phone]*, [address] [place], [place]  [address], Ph: [phone] or [phone], Fax: [phone]    Prescriptions: MOVIPREP 100 GM  SOLR (PEG-KCL-NACL-NASULF-NA ASC-C) As per prep instructions.  #1 x 0   Entered by:   Karl Bales RN   Authorized by:   Rachael Fee MD   Signed by:   Karl Bales RN on 03/04/2010   Method used:   [facility] to        [phone] (retail)       [address]       [place], [place]  [address]       Ph: [phone] or [phone]       Fax: [phone]   RxID:   [id number]

## 2010-03-10 NOTE — Letter (Signed)
Summary: [facility] Instructions  [facility]  [address] [place], [place] [address]   Phone: [phone]  Fax: [phone]       [facility]    February 17, 1960    MRN: [id number]        Procedure Day /Date:  Friday 03/19/2010     Arrival Time: 8:00 am     Procedure Time: 9:00 am     Location of Procedure:                    _x _  [facility] (4th Floor)                        PREPARATION FOR COLONOSCOPY WITH MOVIPREP   Starting 5 days prior to your procedure Sunday 2/12 do not eat nuts, seeds, popcorn, corn, beans, peas,  salads, or any raw vegetables.  Do not take any fiber supplements (e.g. Metamucil, Citrucel, and Benefiber).  THE DAY BEFORE YOUR PROCEDURE         DATE: Thursday 2/16  1.  Drink clear liquids the entire day-NO SOLID FOOD  2.  Do not drink anything colored red or purple.  Avoid juices with pulp.  No orange juice.  3.  Drink at least 64 oz. (8 glasses) of fluid/clear liquids during the day to prevent dehydration and help the prep work efficiently.  CLEAR LIQUIDS INCLUDE: Water Jello Ice Popsicles Tea (sugar ok, no milk/cream) Powdered fruit flavored drinks Coffee (sugar ok, no milk/cream) Gatorade Juice: apple, white grape, white cranberry  Lemonade Clear bullion, consomm, broth Carbonated beverages (any kind) Strained chicken noodle soup Hard Candy                             4.  In the morning, mix first dose of MoviPrep solution:    Empty 1 Pouch A and 1 Pouch B into the disposable container    Add lukewarm drinking water to the top line of the container. Mix to dissolve    Refrigerate (mixed solution should be used within 24 hrs)  5.  Begin drinking the prep at 5:00 p.m. The MoviPrep container is divided by 4 marks.   Every 15 minutes drink the solution down to the next mark (approximately 8 oz) until the full liter is complete.   6.  Follow completed prep with 16 oz of clear liquid of your choice (Nothing  red or purple).  Continue to drink clear liquids until bedtime.  7.  Before going to bed, mix second dose of MoviPrep solution:    Empty 1 Pouch A and 1 Pouch B into the disposable container    Add lukewarm drinking water to the top line of the container. Mix to dissolve    Refrigerate  THE DAY OF YOUR PROCEDURE      DATE: Friday 2/17  Beginning at 4:00 a.m. (5 hours before procedure):         1. Every 15 minutes, drink the solution down to the next mark (approx 8 oz) until the full liter is complete.  2. Follow completed prep with 16 oz. of clear liquid of your choice.    3. You may drink clear liquids until 7:00 am (2 HOURS BEFORE PROCEDURE).   MEDICATION INSTRUCTIONS  Unless otherwise instructed, you should take regular prescription medications with a small sip of water   as early as possible the morning of your  procedure.           OTHER INSTRUCTIONS  You will need a responsible adult at least 51 years of age to accompany you and drive you home.   This person must remain in the waiting room during your procedure.  Wear loose fitting clothing that is easily removed.  Leave jewelry and other valuables at home.  However, you may wish to bring a book to read or  an iPod/MP3 player to listen to music as you wait for your procedure to start.  Remove all body piercing jewelry and leave at home.  Total time from sign-in until discharge is approximately 2-3 hours.  You should go home directly after your procedure and rest.  You can resume normal activities the  day after your procedure.  The day of your procedure you should not:   Drive   Make legal decisions   Operate machinery   Drink alcohol   Return to work  You will receive specific instructions about eating, activities and medications before you leave.    The above instructions have been reviewed and explained to me by   Karl Bales RN  March 04, 2010 2:49 PM    I fully understand and can  verbalize these instructions _____________________________ Date _________

## 2010-03-19 ENCOUNTER — Other Ambulatory Visit (AMBULATORY_SURGERY_CENTER): Payer: BC Managed Care – PPO | Admitting: Gastroenterology

## 2010-03-19 ENCOUNTER — Encounter: Payer: Self-pay | Admitting: Gastroenterology

## 2010-03-19 DIAGNOSIS — Z1211 Encounter for screening for malignant neoplasm of colon: Secondary | ICD-10-CM

## 2010-03-24 NOTE — Procedures (Addendum)
Summary: Colonoscopy  Patient: [name] Note: All result statuses are Final unless otherwise noted.  Tests: (1) Colonoscopy (COL)   COL Colonoscopy           DONE     [facility]     [address]     [place], [place]  [address]           COLONOSCOPY PROCEDURE REPORT           PATIENT:  Jacqueline Molina, Jacqueline Molina  MR#:  [id number]     BIRTHDATE:  Mar 27, 1960, 49 yrs. old  GENDER:  female     ENDOSCOPIST:  Rachael Fee, MD     REF. BY:  Birdie Sons, M.D.     PROCEDURE DATE:  03/19/2010     PROCEDURE:  Diagnostic Colonoscopy     ASA CLASS:  Class II     INDICATIONS:  Routine Risk Screening     MEDICATIONS:  Fentanyl 50 mcg IV, Versed 6 mg IV           DESCRIPTION OF PROCEDURE:   After the risks benefits and     alternatives of the procedure were thoroughly explained, informed     consent was obtained.  Digital rectal exam was performed and     revealed no rectal masses.   The LB PCF-[id number] endoscope     was introduced through the anus and advanced to the cecum, which     was identified by both the appendix and ileocecal valve, without     limitations.  The quality of the prep was excellent, using     MoviPrep.  The instrument was then slowly withdrawn as the colon     was fully examined.     <<PROCEDUREIMAGES>>     FINDINGS:  A normal appearing cecum, ileocecal valve, and     appendiceal orifice were identified. The ascending, hepatic     flexure, transverse, splenic flexure, descending, sigmoid colon,     and rectum appeared unremarkable (see image1, image2, and image3).     Retroflexed views in the rectum revealed no abnormalities.    The     scope was then withdrawn from the patient and the procedure     completed.     COMPLICATIONS:  None           ENDOSCOPIC IMPRESSION:     1) Normal colon     2) No polyps or cancers           RECOMMENDATIONS:     1) You should continue to follow colorectal cancer screening     guidelines for "routine risk"  patients with a repeat colonoscopy     in 10 years. There is no need for FOBT (stool) testing for at     least 5 years.           REPEAT EXAM:  10 years           ______________________________     Rachael Fee, MD           n.     eSIGNED:   Rachael Fee at 03/19/2010 09:18 AM           Leticia Penna, [id number]  Note: An exclamation mark (!) indicates a result that was not dispersed into the flowsheet. Document Creation Date: 03/19/2010 9:19 AM _______________________________________________________________________  (1) Order result status: Final Collection or observation date-time: 03/19/2010 09:13 Requested date-time:  Receipt date-time:  Reported date-time:  Referring Physician:  Ordering Physician: Rob Bunting [phone]) Specimen Source:  Source: Launa Grill Order Number: [id number] Lab site:   Appended Document: Colonoscopy    Clinical Lists Changes  Observations: Added new observation of COLONNXTDUE: 03/2020 (03/22/2010 10:47)

## 2010-06-15 NOTE — Op Note (Signed)
NAMEHAILI, Jacqueline Molina             ACCOUNT NO.:  [id number]   MEDICAL RECORD NO.:  [id number]          PATIENT TYPE:  AMB   LOCATION:  [facility]                           FACILITY:  [facility]   PHYSICIAN:  Genia Del, M.D.DATE OF BIRTH:  November 03, 1960   DATE OF PROCEDURE:  10/11/2007  DATE OF DISCHARGE:                               OPERATIVE REPORT   PREOPERATIVE DIAGNOSIS:  Menorrhagia and submucosal myomas x2.   POSTOPERATIVE DIAGNOSIS:  Menorrhagia and submucosal myomas x2.   PROCEDURE:  Hysteroscopy, resection of 2 submucosal myomas, D&C, and  endometrial ablation with NovaSure.   SURGEON:  Genia Del, MD   ASSISTANT:  None.   PROCEDURE:  Under general anesthesia with laryngeal mask, the patient is  in lithotomy position.  She is prepped with Betadine on the suprapubic,  vulvar, and vaginal areas.  The bladder was catheterized and the patient  is draped as usual.  The vaginal exam under general anesthesia reveals a  retroverted uterus, slightly increased in size.  No adnexal mass.  We  inserted the speculum in the vagina.  The anterior lip of the cervix is  grasped with a tenaculum.  A paracervical block is done with a Nesacaine  1%, 20 mL total, at 4 and 8 o'clock.  We then proceeded with  hysterometry which is at 9 cm and cervical length which is 4 cm for a  cavity length of 5 cm.  We then proceeded with dilatation of the cervix  with Hegar dilators up to #37 without difficulty.  We inserted the  operative hysteroscope in the intrauterine cavity and visualized the  cavity.  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  The one on the left is the larger one measuring more than 2  cm and the one from the right is about 1 cm in diameter.  We proceeded  with resection of the 2 myomas, this was done without any difficulty.  We then do a systematic endometrial curettage with a  sharp curette.  The  specimens are sent separately to Pathology.  Hemostasis is adequate.  We  were using sorbitol for the resection and then saline is used to wash  off the cavity before we proceed with the NovaSure endometrial ablation.  This is done as usual first introducing the device measuring the width  which is at 2.8 cm.  We then proceeded with the cavity integrity test  which is passed and the endometrial ablation which lasted 2 minutes.  We  then removed the instrument.  Hemostasis was completed after removing  the tenaculum on the cervix with silver nitrate.  The speculum is  removed.  The estimated blood loss was minimal.  The fluid deficit was  300 mL.  No complication occurred and the patient was brought to  recovery room in good stable status.      Genia Del, M.D.  Electronically Signed     ML/MEDQ  D:  10/11/2007  T:  10/12/2007  Job:  [id number]

## 2010-06-18 NOTE — H&P (Signed)
[facility]  Patient:    Laurence [facility]                    MRN: [id number] Adm. Date:  [id number] Disc. Date: [id number] Attending:  Wandalee Ferdinand CC:         Katherine Roan, M.D.   History and Physical  HISTORY OF PRESENT ILLNESS:   This is a 50 year old, gravida 1, para 1 with a chief complaint of vulvar mass. The patients history dates back to approximately October 17 when she incurred a fall. She in the immediate aftermath noted a great deal of pain and swelling in her groin and right lower extremity. The latter was sufficiently impressive to warrant a Doppler study to rule out thromboembolic phenomenon. By her history, the study was negative and approximately 1 week later, she noticed diminished groin discomfort and swelling but the emergence of a right vulvar mass. Thus far, she has treated it as a hematoma with rest and observation and over time it has gradually diminished in size. Approximately 1 week ago, she experienced low back pain followed by left lower quadrant discomfort which suddenly abated with a marked exacerbation in the size of the right labial mass. She specifically stated it got much larger after lying down for 8 hours. Periods are currently regular and she is on Lo/Ovral.  MEDICATIONS:                  As above plus Prilosec.  PAST MEDICAL HISTORY:         Reflux esophagitis.  PAST SURGICAL HISTORY:        Hysteroscopic myomectomy and orthopedic surgery on the right knee.  ALLERGIES:                    No known allergies.  FAMILY HISTORY:               Noncontributory.  SOCIAL HISTORY:               Noncontributory.  PHYSICAL EXAMINATION:  GENERAL:                      Well-developed, well-nourished, pleasant white female in no acute distress.  VITAL SIGNS:                  Afebrile, vital signs stable. Temperature approximately 99.5.  SKIN:                         Warm and dry without lesions.  LYMPH  NODE:                   No supraclavicular, cervical or inguinal adenopathy.  CHEST:                        Lungs are clear.  CARDIAC:                      Irregular rate and rhythm.  BREASTS:                      Deferred.  ABDOMEN:                      Soft and nontender without masses or organomegaly. Bowel sounds are active.  MUSCULOSKELETAL:  Reveals full range of motion without edema, cyanosis, or CVA tenderness.  PELVIC:                       External genitalia reveals a marked enlargement of the right labium majoris with a mass contained within it, particularly anteriorly of approximately 6 x 2 x 2 cm. The mass is hard, mobile, non-fluctuant, and tender to palpation. There is associated erythema extending anteriorly slightly onto the mons pubis. There is minimal distortion of the anatomy medially including slight distortion of the clitoris. The posterior aspect of the introitus appears unaffected. Specifically the area of the bartholins gland appears completely uninvolved. The vagina is without lesions. The cervix is visualized and appears normal. There is old blood in the vault consistent with recent menstruation. Bimanual examination is challenging due to the patients discomfort. However, the uterus appears to be approximately 8 x 4 x 4 cm, retroverted and I can appreciate no adnexal masses. Rectovaginal exam confirms.  Ultrasound was obtained both of the mass and of the patients pelvis. The uterus appears retroverted and adnexa are bilaterally unremarkable. The labial portion of the ultrasound revealed a complex mass. There is on flow in the mass. Radiographic differential includes phlegmon and early abscess versus neoplasm.  CBC reveals a white blood cell count of approximately 11,000.  IMPRESSION:                   Vulvar (right labial) mass--probable infected hematoma. Doubt hernia, abscess, neoplasm.  PLAN:                         Will try floxin 400  mg p.o. b.i.d. The patient already satisfactory analgesics at home consisting of ibuprofen and narcotics as well hence no additional analgesics filled.  She is to call back within 24-48 hours to inform me and/or Dr. Elana Alm of her clinical course. Should she develop increasing tenderness, erythema, size, etc., the mass may need to be evacuated. However, will observe closely with a medical trial prior to any surgical intervention.  The patient states she understands and accepts the plan. Questions invited and answered. DD:  01/31/00 TD:  01/31/00 Job: [id number]

## 2010-06-18 NOTE — Op Note (Signed)
NAMESAVANAH, BAYLES             ACCOUNT NO.:  [id number]   MEDICAL RECORD NO.:  [id number]          PATIENT TYPE:  OIB   LOCATION:  3172                         FACILITY:  [facility]   PHYSICIAN:  Coletta Memos, M.D.     DATE OF BIRTH:  November 09, 1960   DATE OF PROCEDURE:  01/25/2006  DATE OF DISCHARGE:                               OPERATIVE REPORT   PREOPERATIVE DIAGNOSIS:  Cervical spondylosis without myelopathy C5-6,  C6-7.   POSTOPERATIVE DIAGNOSES:  Cervical spondylosis without myelopathy C5-6,  C6-7.   PROCEDURE:  1. Anterior cervical decompression C5-6, C6-7.  2. Arthrodesis C5-C7 with two 6-mm ACA allograft C8.  3. Anterior instrumentation; a 30 mm reflex hybrid plate with 14 mm      screws.   COMPLICATIONS:  None.   SURGEON:  Coletta Memos, M.D.   ASSISTANT:  Cristi Loron, M.D.   INDICATIONS:  Ms. Jacqueline Molina is a woman who presented with neck pain.  She  had significant spondylitic change present on the cervical spine with  some cord deformation.  I therefore recommended; and she agreed to  undergo operative decompression.   OPERATIVE NOTE:  Ms. Jacqueline Molina was brought to the operating room intubated  and placed under a general anesthetic without difficulty.  Her head was  positioned on a horseshoe headrest in slight extension.  The neck was  prepped.  She was draped in a sterile fashion.  I infiltrated 4 mL, 0.5%  lidocaine 1:200,000 strength epinephrine into the cervical region  starting from the midline, extending to the medial border of the left  sternocleidomastoid muscle.   I opened the skin with a #10 blade; and I took this down sharply to the  platysma.  I dissected rostrally and caudally in a plane superiorly to  the platysma.  I opened the platysma in a horizontal fashion using  Metzenbaum scissors.  I then dissected rostrally and caudally in the  plane inferior to the platysma.  I identified the medial strap muscles  and the sternocleidomastoid.  I retracted  the strap muscles medially;  and could dissected through an avascular corridor to the cervical spine.  I then placed the spinal needle which was in the C6-7 disk space by x-  ray.  I then reflected the longus colli muscles bilaterally.  I placed a  self-retaining retractor.  I then opened the disk spaces at C6-7 and at  C5-6 using a 15-blade.  I used a curette to remove a good deal of disk  material and endplate.  I used an osteophyte to take off the anterior  osteophytes.  I then placed the distraction pins one at C6 and the other  in C7; opened that space and then proceeded with the decompression.   Using a high-speed drill, Kerrison punches, and curettes.  I removed  disk, endplate, and osteophytes until I had reached the level of the  posterior longitudinal ligament which was thickened and redundant.  I  opened that with a 1-Kerrison punch; and then removed the ligament and  undercut the bones to fully decompress the spinal canal at C6-7.  I also  decompressed both C7 nerve roots bilaterally using the punch and drill.  When I was satisfied with the decompression, I then turned towards the  arthrodesis.  The endplates had been prepared for arthrodesis using the  drill and curettes.  I then placed a 6-mm graft after sizing it, this  being an allograft CA graft produced by [employer].  When that was done, I  then removed the distraction pin at C7 and placed it into C5.  I  distracted the C5-6 disk space and proceeded with the decompression  there.   The decompression was performed using a high-speed drill, Kerrison  punches, and curettes.  I removed ligament, bone, and osteophyte until I  had decompressed the spinal canal, and decompressed both C6 nerve roots.  There was a great deal of redundant ligament and partially calcified  ligament at that level; and it clearly was in need of decompression.  After decompressing the nerve roots.  I then turned my attention to the  arthrodesis.   I  prepared the arthrodesis by preparing the endplates with the curettes  and the drill.  I placed another 6-mm graft without difficulty.  I then  removed the distraction pins from C5, then C6.  With Dr. Lovell Sheehan'  assistance we then placed a reflex hybrid plate 30 mm in length, two  screws in C5, two in C6, and two in C7 without difficulty.  Each screw  was first drilled; and then I used self-tapping screws.  I irrigated the  wound.  I then closed the wound in a layered fashion using Vicryl  sutures with Dr. Lovell Sheehan' assistance.  X-ray showed that the plate,  plugs, and screws were all in good position.  The patient tolerated the  procedure well.           ______________________________  Coletta Memos, M.D.     KC/MEDQ  D:  01/25/2006  T:  01/25/2006  Job:  [id number]

## 2010-06-18 NOTE — Consult Note (Signed)
[facility]  Patient:    Jacqueline Molina, Jacqueline Molina                    MRN: [id number] Proc. Date: 02/22/00 Adm. Date:  [id number] Disc. Date: [id number] Attending:  Wandalee Ferdinand CC:         Rudy Jew. Ashley Royalty, M.D.  Katherine Roan, M.D.   Consultation Report  REASON FOR CONSULTATION:  This 50 year old woman is referred for a second opinion regarding etiology and management of a right labial mass.  HISTORY OF PRESENT ILLNESS:  The patient has a history of traumatic fall in mid-October, falling down the steps and suffering acute pain and swelling in her groin and right lower extremity.  She was evaluated by an orthopedist and had Doppler flow studies which ruled out DVT.  She apparently required tapping of a knee effusion.  In late October/early November, she noted the development of right vulvar mass which she treated as a hematoma with rest and local heat, and it gradually diminished in size initially.  She then suffered an episode of low back pain treated with prolonged inactivity, sitting at rest with an enlargement in the size.  She has had recurrent fevers in the past and was treated with courses of antibiotics, most recently following a marked exacerbation towards the end of December when she had completed a course of three weeks of antibiotic therapy.  She has undergone ultrasound scans which revealed normal internal pelvic organs, and ultrasound on December 29 revealed a 3-cm complex mass in the right labia majora with adjacent soft tissue edema having the appearance of resolving hematoma, phlegmon, early abscess, or neoplasm.  Because of the chronic course, the patient wishes a second opinion regarding management.  Over the past two weeks, she has had marked improvement in symptoms and decrease in size.  PAST MEDICAL HISTORY:  Significant for GERD.  PAST SURGICAL HISTORY:  Hysteroscopic myomectomy and orthopedic surgery on the right  knee.  CURRENT MEDICATIONS:  Prilosec and Lo/Ovral.  ALLERGIES:  None known.  FAMILY MEDICAL HISTORY:  No gynecologic malignancy.  PERSONAL AND SOCIAL HISTORY:  Married, denies tobacco or ethanol use.  REVIEW OF SYSTEMS:  Otherwise negative.  PHYSICAL EXAMINATION:  VITAL SIGNS:  Blood pressure 118/76, weight 150 pounds, pulse 96, respirations 20.  ABDOMEN:  Soft and benign without ascites or mass.  There is no tenderness.  MUSCULOSKELETAL:  There is no back or CVA tenderness.  EXTREMITIES:  Full range of motion without limitations or edema.  PELVIC:  External genitalia and BUS are normal to inspection and palpation. At the dependent right labia majora there is a 3 x 1.5 x 1.5 firm mass, indurated and slightly irregular.  There is no clitoral distortion or involvement.  The Bartholins region is entirely normal.  Vagina is clear with good support of bladder and urethra.  Cervix is normal.  Bimanual examination reveals small uterus and no adnexal mass.  ASSESSMENT:  Probable vulvar hematoma with secondary infection, currently resolving.  PLAN:  The patient is basically reassured.  We discussed the time course of these hematomas, which will often take months to completely resolve.  We discussed risks for secondary infection, which I believe should be decreasing, and she will follow up with Dr. Elana Alm and/or Dr. Ashley Royalty in the future.  We would be glad to see her back at any time on a p.r.n. basis should she have a flare or concern that she is not continuing to resolve. DD:  02/22/00  TD:  02/22/00 Job: [id number]

## 2010-08-24 ENCOUNTER — Other Ambulatory Visit: Payer: Self-pay | Admitting: Obstetrics & Gynecology

## 2010-08-24 DIAGNOSIS — Z1231 Encounter for screening mammogram for malignant neoplasm of breast: Secondary | ICD-10-CM

## 2010-09-30 ENCOUNTER — Ambulatory Visit: Payer: BC Managed Care – PPO

## 2010-09-30 ENCOUNTER — Ambulatory Visit
Admission: RE | Admit: 2010-09-30 | Discharge: 2010-09-30 | Disposition: A | Discharge status: Home / Self Care | Payer: BC Managed Care – PPO | Source: Ambulatory Visit | Attending: Obstetrics & Gynecology | Admitting: Obstetrics & Gynecology

## 2010-09-30 DIAGNOSIS — Z1231 Encounter for screening mammogram for malignant neoplasm of breast: Secondary | ICD-10-CM

## 2010-11-03 LAB — CBC
HCT: 39.4
Hemoglobin: 13.4
MCHC: 33.9
MCV: 100.1 — ABNORMAL HIGH
RBC: 3.94

## 2010-12-31 ENCOUNTER — Other Ambulatory Visit (INDEPENDENT_AMBULATORY_CARE_PROVIDER_SITE_OTHER): Payer: BC Managed Care – PPO

## 2010-12-31 DIAGNOSIS — Z0000 Encounter for general adult medical examination without abnormal findings: Secondary | ICD-10-CM

## 2010-12-31 LAB — BASIC METABOLIC PANEL
CO2: 26 mEq/L (ref 19–32)
Glucose, Bld: 98 mg/dL (ref 70–99)
Potassium: 4.2 mEq/L (ref 3.5–5.1)
Sodium: 140 mEq/L (ref 135–145)

## 2010-12-31 LAB — CBC WITH DIFFERENTIAL/PLATELET
Basophils Absolute: 0 10*3/uL (ref 0.0–0.1)
Eosinophils Absolute: 0.1 10*3/uL (ref 0.0–0.7)
HCT: 40.2 % (ref 36.0–46.0)
Hemoglobin: 13.8 g/dL (ref 12.0–15.0)
Lymphs Abs: 1.1 10*3/uL (ref 0.7–4.0)
MCHC: 34.2 g/dL (ref 30.0–36.0)
Neutro Abs: 2.4 10*3/uL (ref 1.4–7.7)
RDW: 13.1 % (ref 11.5–14.6)

## 2010-12-31 LAB — POCT URINALYSIS DIPSTICK
Bilirubin, UA: NEGATIVE
Ketones, UA: NEGATIVE
pH, UA: 5

## 2010-12-31 LAB — LIPID PANEL
HDL: 76.4 mg/dL (ref >39.00)
Triglycerides: 64 mg/dL (ref 0.0–149.0)

## 2010-12-31 LAB — HEPATIC FUNCTION PANEL
AST: 21 U/L (ref 0–37)
Albumin: 4.7 g/dL (ref 3.5–5.2)

## 2010-12-31 LAB — TSH: TSH: 1.45 u[IU]/mL (ref 0.35–5.50)

## 2011-01-07 ENCOUNTER — Encounter: Payer: Self-pay | Admitting: Internal Medicine

## 2011-01-07 ENCOUNTER — Ambulatory Visit (INDEPENDENT_AMBULATORY_CARE_PROVIDER_SITE_OTHER): Payer: BC Managed Care – PPO | Admitting: Internal Medicine

## 2011-01-07 VITALS — BP 114/76 | HR 92 | Temp 99.4°F | Ht 66.5 in | Wt 157.0 lb

## 2011-01-07 DIAGNOSIS — Z0000 Encounter for general adult medical examination without abnormal findings: Secondary | ICD-10-CM

## 2011-01-07 NOTE — Progress Notes (Signed)
Patient ID: MACKINSEY PELLAND, female   DOB: [birth year], 50 y.o.   MRN: [id number] cpx  No past medical history on file.  History   Social History  . Marital Status: Widowed    Spouse Name: N/A    Number of Children: N/A  . Years of Education: N/A   Occupational History  . Not on file.   Social History Main Topics  . Smoking status: Never Smoker   . Smokeless tobacco: Not on file  . Alcohol Use: 0.0 oz/week     socially on weekends  . Drug Use: Not on file  . Sexually Active: Not on file   Other Topics Concern  . Not on file   Social History Narrative   Husband died from esophageal cancer [id number] daughter healthy, four stepdaughters    Past Surgical History  Procedure Date  . Neck fusion C5-C6-C7    12/07  . Breast enhancement surgery     2003  . Bone graft collarbone     2004  . Right knee surgery for bone spur     age 89    Family History  Problem Relation Age of Onset  . Diabetes Father   . Hypertension Father   . Hyperlipidemia Father   . Hypertension Mother     No Known Allergies  Current Outpatient Prescriptions on File Prior to Visit  Medication Sig Dispense Refill  . Multiple Vitamins-Calcium (VIACTIV MULTI-VITAMIN) CHEW Chew by mouth 2 (two) times daily.        . norethindrone (MICRONOR) 0.35 MG tablet Take 1 tablet by mouth daily.        Marland Kitchen omeprazole-sodium bicarbonate (ZEGERID) 40-1100 MG per capsule One a day before meals          patient denies chest pain, shortness of breath, orthopnea. Denies lower extremity edema, abdominal pain, change in appetite, change in bowel movements. Patient denies rashes, musculoskeletal complaints. No other specific complaints in a complete review of systems.   BP 114/76  Pulse 92  Temp(Src) 99.4 F (37.4 C) (Oral)  Ht 5' 6.5" (1.689 m)  Wt 157 lb (71.215 kg)  BMI 24.96 kg/m2  Well-developed well-nourished female in no acute distress. HEENT exam atraumatic, normocephalic, extraocular muscles are intact. Neck  is supple. No jugular venous distention no thyromegaly. Chest clear to auscultation without increased work of breathing. Cardiac exam S1 and S2 are regular. Abdominal exam active bowel sounds, soft, nontender. Extremities no edema. Neurologic exam she is alert without any motor sensory deficits. Gait is normal.  A/P Well visit---health maint UTD

## 2011-01-09 ENCOUNTER — Other Ambulatory Visit: Payer: Self-pay | Admitting: Internal Medicine

## 2011-04-16 ENCOUNTER — Other Ambulatory Visit: Payer: Self-pay | Admitting: Internal Medicine

## 2011-07-19 ENCOUNTER — Other Ambulatory Visit: Payer: Self-pay | Admitting: *Deleted

## 2011-07-19 MED ORDER — OMEPRAZOLE-SODIUM BICARBONATE 40-1100 MG PO CAPS: 1.0000 | ORAL_CAPSULE | Freq: Every day | ORAL | Status: DC

## 2011-09-23 ENCOUNTER — Other Ambulatory Visit: Payer: Self-pay | Admitting: Obstetrics & Gynecology

## 2011-09-23 DIAGNOSIS — Z1231 Encounter for screening mammogram for malignant neoplasm of breast: Secondary | ICD-10-CM

## 2011-10-10 ENCOUNTER — Telehealth: Payer: Self-pay | Admitting: Family Medicine

## 2011-10-10 MED ORDER — OMEPRAZOLE-SODIUM BICARBONATE 40-1100 MG PO CAPS: 1.0000 | ORAL_CAPSULE | Freq: Every day | ORAL | Status: DC

## 2011-10-10 NOTE — Telephone Encounter (Signed)
rx resent to pharmacy

## 2011-10-10 NOTE — Telephone Encounter (Signed)
Pt needs refill 90-days worth at [facility]. [facility] on [address]. Her name used to be [employer].

## 2011-10-18 ENCOUNTER — Ambulatory Visit
Admission: RE | Admit: 2011-10-18 | Discharge: 2011-10-18 | Disposition: A | Discharge status: Home / Self Care | Payer: BC Managed Care – PPO | Source: Ambulatory Visit | Attending: Obstetrics & Gynecology | Admitting: Obstetrics & Gynecology

## 2011-10-18 DIAGNOSIS — Z1231 Encounter for screening mammogram for malignant neoplasm of breast: Secondary | ICD-10-CM

## 2011-12-05 ENCOUNTER — Other Ambulatory Visit: Payer: Self-pay | Admitting: Dermatology

## 2012-01-02 ENCOUNTER — Other Ambulatory Visit (INDEPENDENT_AMBULATORY_CARE_PROVIDER_SITE_OTHER): Payer: BC Managed Care – PPO

## 2012-01-02 DIAGNOSIS — Z0000 Encounter for general adult medical examination without abnormal findings: Secondary | ICD-10-CM

## 2012-01-02 LAB — BASIC METABOLIC PANEL
CO2: 26 mEq/L (ref 19–32)
Chloride: 104 mEq/L (ref 96–112)
Potassium: 5 mEq/L (ref 3.5–5.1)

## 2012-01-02 LAB — CBC WITH DIFFERENTIAL/PLATELET
Basophils Relative: 0.7 % (ref 0.0–3.0)
Eosinophils Relative: 1.6 % (ref 0.0–5.0)
HCT: 41.7 % (ref 36.0–46.0)
Hemoglobin: 13.9 g/dL (ref 12.0–15.0)
Lymphs Abs: 1.2 10*3/uL (ref 0.7–4.0)
MCV: 100.2 fl — ABNORMAL HIGH (ref 78.0–100.0)
Monocytes Absolute: 0.3 10*3/uL (ref 0.1–1.0)
Neutro Abs: 2.8 10*3/uL (ref 1.4–7.7)
RBC: 4.16 Mil/uL (ref 3.87–5.11)
WBC: 4.5 10*3/uL (ref 4.5–10.5)

## 2012-01-02 LAB — POCT URINALYSIS DIPSTICK
Ketones, UA: NEGATIVE
Leukocytes, UA: NEGATIVE
Protein, UA: NEGATIVE
pH, UA: 6.5

## 2012-01-02 LAB — HEPATIC FUNCTION PANEL
ALT: 15 U/L (ref 0–35)
Albumin: 4.5 g/dL (ref 3.5–5.2)
Bilirubin, Direct: 0.1 mg/dL (ref 0.0–0.3)
Total Protein: 7.3 g/dL (ref 6.0–8.3)

## 2012-01-02 LAB — LIPID PANEL
LDL Cholesterol: 86 mg/dL (ref 0–99)
VLDL: 16.2 mg/dL (ref 0.0–40.0)

## 2012-01-03 ENCOUNTER — Ambulatory Visit: Payer: BC Managed Care – PPO

## 2012-01-03 DIAGNOSIS — R7309 Other abnormal glucose: Secondary | ICD-10-CM

## 2012-01-03 LAB — HEMOGLOBIN A1C: Hgb A1c MFr Bld: 5.6 % (ref 4.6–6.5)

## 2012-01-10 ENCOUNTER — Ambulatory Visit (INDEPENDENT_AMBULATORY_CARE_PROVIDER_SITE_OTHER): Payer: BC Managed Care – PPO | Admitting: Internal Medicine

## 2012-01-10 ENCOUNTER — Encounter: Payer: Self-pay | Admitting: Internal Medicine

## 2012-01-10 VITALS — BP 118/86 | HR 80 | Temp 99.2°F | Ht 67.0 in | Wt 167.0 lb

## 2012-01-10 DIAGNOSIS — Z23 Encounter for immunization: Secondary | ICD-10-CM

## 2012-01-10 DIAGNOSIS — Z0000 Encounter for general adult medical examination without abnormal findings: Secondary | ICD-10-CM

## 2012-01-10 NOTE — Progress Notes (Signed)
Patient ID: Jacqueline Molina, female   DOB: 12/22/1960, 51 y.o.   MRN: [id number]  cpx  History reviewed. No pertinent past medical history.  History   Social History  . Marital Status: Married    Spouse Name: N/A    Number of Children: N/A  . Years of Education: N/A   Occupational History  . Not on file.   Social History Main Topics  . Smoking status: Never Smoker   . Smokeless tobacco: Not on file  . Alcohol Use: 0.0 oz/week     Comment: socially on weekends  . Drug Use: Not on file  . Sexually Active: Not on file   Other Topics Concern  . Not on file   Social History Narrative   Husband died from esophageal cancer [id number] daughter healthy, four stepdaughters    Past Surgical History  Procedure Date  . Neck fusion C5-C6-C7    12/07  . Breast enhancement surgery     2003  . Bone graft collarbone     2004  . Right knee surgery for bone spur     age 89    Family History  Problem Relation Age of Onset  . Diabetes Father   . Hypertension Father   . Hyperlipidemia Father   . Dementia Father   . Hypertension Mother     No Known Allergies  Current Outpatient Prescriptions on File Prior to Visit  Medication Sig Dispense Refill  . Multiple Vitamins-Calcium (VIACTIV MULTI-VITAMIN) CHEW Chew by mouth 2 (two) times daily.        Marland Kitchen omeprazole-sodium bicarbonate (ZEGERID) 40-1100 MG per capsule Take 1 capsule by mouth daily before breakfast.  90 capsule  1     patient denies chest pain, shortness of breath, orthopnea. Denies lower extremity edema, abdominal pain, change in appetite, change in bowel movements. Patient denies rashes, musculoskeletal complaints. No other specific complaints in a complete review of systems.   BP 118/86  Pulse 80  Temp 99.2 F (37.3 C) (Oral)  Ht 5\' 7"  (1.702 m)  Wt 167 lb (75.751 kg)  BMI 26.16 kg/m2  Well-developed well-nourished female in no acute distress. HEENT exam atraumatic, normocephalic, extraocular muscles are intact. Neck  is supple. No jugular venous distention no thyromegaly. Chest clear to auscultation without increased work of breathing. Cardiac exam S1 and S2 are regular. Abdominal exam active bowel sounds, soft, nontender. Extremities no edema. Neurologic exam she is alert without any motor sensory deficits. Gait is normal.   Well Visit_ health maint- UTD

## 2012-06-05 ENCOUNTER — Other Ambulatory Visit: Payer: Self-pay | Admitting: Dermatology

## 2012-09-14 ENCOUNTER — Other Ambulatory Visit: Payer: Self-pay

## 2012-09-14 DIAGNOSIS — Z1231 Encounter for screening mammogram for malignant neoplasm of breast: Secondary | ICD-10-CM

## 2012-11-01 ENCOUNTER — Ambulatory Visit
Admission: RE | Admit: 2012-11-01 | Discharge: 2012-11-01 | Disposition: A | Discharge status: Home / Self Care | Payer: BC Managed Care – PPO | Source: Ambulatory Visit

## 2012-11-01 DIAGNOSIS — Z1231 Encounter for screening mammogram for malignant neoplasm of breast: Secondary | ICD-10-CM

## 2012-11-16 ENCOUNTER — Other Ambulatory Visit: Payer: Self-pay | Admitting: Dermatology

## 2012-12-06 ENCOUNTER — Other Ambulatory Visit: Payer: Self-pay

## 2013-02-06 ENCOUNTER — Emergency Department (HOSPITAL_COMMUNITY)
Admission: EM | Admit: 2013-02-06 | Discharge: 2013-02-06 | Disposition: A | Discharge status: Home / Self Care | Payer: BC Managed Care – PPO | Attending: Emergency Medicine | Admitting: Emergency Medicine

## 2013-02-06 ENCOUNTER — Telehealth: Payer: Self-pay | Admitting: Internal Medicine

## 2013-02-06 ENCOUNTER — Encounter (HOSPITAL_COMMUNITY): Payer: Self-pay | Admitting: Emergency Medicine

## 2013-02-06 DIAGNOSIS — Z791 Long term (current) use of non-steroidal anti-inflammatories (NSAID): Secondary | ICD-10-CM | POA: Insufficient documentation

## 2013-02-06 DIAGNOSIS — M25511 Pain in right shoulder: Secondary | ICD-10-CM

## 2013-02-06 DIAGNOSIS — R209 Unspecified disturbances of skin sensation: Secondary | ICD-10-CM | POA: Insufficient documentation

## 2013-02-06 DIAGNOSIS — Z79899 Other long term (current) drug therapy: Secondary | ICD-10-CM | POA: Insufficient documentation

## 2013-02-06 DIAGNOSIS — Z9889 Other specified postprocedural states: Secondary | ICD-10-CM | POA: Insufficient documentation

## 2013-02-06 DIAGNOSIS — M25519 Pain in unspecified shoulder: Secondary | ICD-10-CM | POA: Insufficient documentation

## 2013-02-06 DIAGNOSIS — R52 Pain, unspecified: Secondary | ICD-10-CM | POA: Insufficient documentation

## 2013-02-06 DIAGNOSIS — M549 Dorsalgia, unspecified: Secondary | ICD-10-CM | POA: Insufficient documentation

## 2013-02-06 MED ORDER — NAPROXEN 500 MG PO TABS: 500.0000 mg | ORAL_TABLET | Freq: Two times a day (BID) | ORAL | Status: DC

## 2013-02-06 NOTE — ED Notes (Signed)
Pt states rt shoulder blade pain this morning radiating down arm.  Now back only in back/shoulder blade.  Notified MD and told to come to ED.  No cardiac history.

## 2013-02-06 NOTE — Telephone Encounter (Signed)
Pt went to the ER.

## 2013-02-06 NOTE — ED Provider Notes (Signed)
CSN: [id number]     Arrival date & time 02/06/13  1333 History  This chart was scribed for Yvette Rack, PA-C, working with Mirna Mires, MD by Maree Erie, ED Scribe. This patient was seen in room WTR8/WTR8 and the patient's care was started at 2:57 PM.    Chief Complaint  Patient presents with  . Shoulder Pain  . Back Pain    Patient is a 53 y.o. female presenting with shoulder pain and back pain. The history is provided by the patient. No language interpreter was used.  Shoulder Pain Pertinent negatives include no abdominal pain and no headaches.  Back Pain Associated symptoms: no abdominal pain and no headaches   Shoulder Pain Pertinent negatives include no abdominal pain, headaches, nausea or vomiting.    HPI Comments: Jacqueline Molina is a 53 y.o. female who presents to the Emergency Department complaining of constant, improving severe right shoulder blade pain that woke her up from sleep this morning. The pain radiates down her right arm and right upper chest. She states that she took an 81 mg aspirin upon waking, three Advil four hours ago and placed a heating pack on the shoulder blade for three hours with significant relief. She also reports her right hand has been experiencing paresthesias intermittently recently, especially after sleeping in certain positions. She states she was advised by her PCP to come to the ED to rule out any cardiac issues. She denies shortness of breath, dizziness or chest pain. She states she has a past surgical history of a neck fusion (C5,6,7). She denies any pertinent medical history, including heart arrhythmias or problems. She states her father has a history of HTN and CAD. He has passed away but she denies he died of heart problems. She states her mother has a history of HTN and CHF.   History reviewed. No pertinent past medical history. Past Surgical History  Procedure Laterality Date  . Neck fusion  C5-C6-C7    12/07  . Breast enhancement  surgery      2003  . Bone graft collarbone      2004  . Right knee surgery for bone spur      age 89   Family History  Problem Relation Age of Onset  . Diabetes Father   . Hypertension Father   . Hyperlipidemia Father   . Dementia Father   . Hypertension Mother    History  Substance Use Topics  . Smoking status: Never Smoker   . Smokeless tobacco: Not on file  . Alcohol Use: 0.0 oz/week     Comment: socially on weekends   OB History   Grav Para Term Preterm Abortions TAB SAB Ect Mult Living                 Review of Systems  Eyes: Negative for visual disturbance.  Cardiovascular: Negative for palpitations and leg swelling.  Gastrointestinal: Negative for nausea, vomiting and abdominal pain.  Musculoskeletal: Positive for back pain.  Neurological: Negative for headaches.  All other systems reviewed and are negative.    Allergies  Review of patient's allergies indicates no known allergies.  Home Medications   Current Outpatient Rx  Name  Route  Sig  Dispense  Refill  . glucosamine-chondroitin 500-400 MG tablet   Oral   Take 1 tablet by mouth daily.         Marland Kitchen ibuprofen (ADVIL,MOTRIN) 200 MG tablet   Oral   Take 600 mg by mouth once.         Marland Kitchen  Lansoprazole (PREVACID 24HR PO)   Oral   Take 1 capsule by mouth daily.         . Multiple Vitamins-Calcium (VIACTIV MULTI-VITAMIN) CHEW   Oral   Chew by mouth 2 (two) times daily.           . naproxen (NAPROSYN) 500 MG tablet   Oral   Take 1 tablet (500 mg total) by mouth 2 (two) times daily with a meal.   30 tablet   1    Triage Vitals: BP 151/90  Pulse 80  Temp(Src) 98.4 F (36.9 C) (Oral)  Resp 14  SpO2 98%  Physical Exam  Nursing note and vitals reviewed. Constitutional: She is oriented to person, place, and time. She appears well-developed and well-nourished. No distress.  HENT:  Head: Normocephalic and atraumatic.  Eyes: Conjunctivae and EOM are normal. No scleral icterus.  Neck: Normal  range of motion. Neck supple. No tracheal deviation present.  Cardiovascular: Normal rate, regular rhythm, normal heart sounds, intact distal pulses and normal pulses.  Exam reveals no gallop and no friction rub.   No murmur heard. Pulmonary/Chest: Effort normal and breath sounds normal. No respiratory distress. She has no wheezes. She has no rhonchi. She has no rales. She exhibits no tenderness.  Musculoskeletal: Normal range of motion.       Right shoulder: Normal.  Pain unable to be reproduced on exam. Patient noted to have tight trapezius muscles.     Neurological: She is alert and oriented to person, place, and time.  Cervical compression does not illicit symptoms.   Skin: Skin is warm and dry.  Psychiatric: She has a normal mood and affect. Her behavior is normal.    ED Course  Procedures (including critical care time)  DIAGNOSTIC STUDIES: Oxygen Saturation is 98% on room air, normal by my interpretation.    COORDINATION OF CARE: 3:06 PM -Will discharge patient with Naproxen. Patient states she will not take muscle relaxers if prescribed because she does not like feeling sedated, so will not order any. Patient verbalizes understanding and agrees with treatment plan.    Labs Review Labs Reviewed - No data to display Imaging Review No results found.  EKG Interpretation   None       MDM   1. Shoulder pain, acute, right    EKG shows no signs of STEMI. Patient has no risk factors for CAD. Doubt cardiac etiology. Recommend patient take medication as directed. Follow up with PCP in 2 days to recheck pain symptoms. Recommend seeking physical therapy as exercises and stretching will be beneficial. Your blood pressure was mildly elevated today, suspect may be related to patient being anxious because she thought pain might be related to her heart. Recommend follow up with your PCP on this to ensure this is not a chronic condition.   Patient discharged in good condition.   Meds  given in ED:  Medications - No data to display  Discharge Medication List as of 02/06/2013  3:15 PM    START taking these medications   Details  naproxen (NAPROSYN) 500 MG tablet Take 1 tablet (500 mg total) by mouth 2 (two) times daily with a meal., Starting 02/06/2013, Until Discontinued, Print        I personally performed the services described in this documentation, which was scribed in my presence. The recorded information has been reviewed and is accurate.    Sherrie George, PA-C 02/07/13 1505

## 2013-02-06 NOTE — Telephone Encounter (Signed)
Patient Information:  Caller Name: Kazue  Phone: [phone]  Patient: Jacqueline Molina, Jacqueline Molina  Gender: Female  DOB: [birth year]  Age: 53 Years  PCP: Phoebe Sharps (Adults only)  Pregnant: No  Office Follow Up:  Does the office need to follow up with this patient?: No  Instructions For The Office: N/A  RN Note:  Menopausal. Woke up with severe back pain under right shoulder blade described as sharp and worse with movement.   Inially pain radiated to right arm.  Pain rated 10/10 for for 45-60 minutes.  Current chest pain rated 3/10.  Brief acute nausea resolved.  No sweating or shortness of breath. History of hypertension; not on any meds. Advised to call 911 for chest pain present > 5 minutes and age > 50 per Chest Pain.   Symptoms  Reason For Call & Symptoms: Severe right upper back pain that woke her up from sleep at 0630; mild pain present now.  Pain radiated to right arm.  Reviewed Health History In EMR: Yes  Reviewed Medications In EMR: Yes  Reviewed Allergies In EMR: Yes  Reviewed Surgeries / Procedures: Yes  Date of Onset of Symptoms: 02/06/2013  Treatments Tried: ASA 81 mg, 600 mg Ibuprofen at 1100, heating pad  Treatments Tried Worked: Yes OB / GYN:  LMP: Unknown  Guideline(s) Used:  Back Pain  Chest Pain  Disposition Per Guideline:   Call EMS 911 Now  Reason For Disposition Reached:   Chest pain lasting longer than 5 minutes and ANY of the following:  Over 33 years old Over 27 years old and at least one cardiac risk factor (i.e., high blood pressure, diabetes, high cholesterol, obesity, smoker or strong family history of heart disease) Pain is crushing, pressure-like, or heavy  Took nitroglycerin and chest pain was not relieved History of heart disease (i.e., angina, heart attack, bypass surgery, angioplasty, CHF)  Advice Given:  N/A  Patient Will Follow Care Advice:  YES

## 2013-02-06 NOTE — Discharge Instructions (Signed)
Take medication as directed. Follow up with PCP in 2 days to recheck pain symptoms. Recommend seeking physical therapy as exercises and stretching will be beneficial. Your blood pressure was mildly elevated today. Recommend follow up with your PCP on this to ensure this is not a chronic condition.

## 2013-02-06 NOTE — ED Notes (Signed)
Pt was woken up this morning by rt shoulder/shoulder blade pain, radiating down rt arm.  Denies SOB, dizziness, NVD.

## 2013-02-08 NOTE — ED Provider Notes (Signed)
Medical screening examination/treatment/procedure(s) were performed by non-physician practitioner and as supervising physician I was immediately available for consultation/collaboration.  EKG Interpretation    Date/Time:  Wednesday February 06 2013 13:44:14 EST Ventricular Rate:  82 PR Interval:  156 QRS Duration: 94 QT Interval:  367 QTC Calculation: 429 R Axis:   85 Text Interpretation:  Sinus rhythm Low voltage, precordial leads Baseline wander in lead(s) V5 ED PHYSICIAN INTERPRETATION AVAILABLE IN [facility] Confirmed by TEST, RECORD ([id number]) on 02/08/2013 11:52:07 AM              Mirna Mires, MD 02/08/13 [phone]

## 2013-08-15 ENCOUNTER — Ambulatory Visit (INDEPENDENT_AMBULATORY_CARE_PROVIDER_SITE_OTHER): Payer: BC Managed Care – PPO | Admitting: Family Medicine

## 2013-08-15 ENCOUNTER — Encounter: Payer: Self-pay | Admitting: Family Medicine

## 2013-08-15 VITALS — BP 130/90 | HR 85 | Temp 98.3°F | Wt 173.0 lb

## 2013-08-15 DIAGNOSIS — R5381 Other malaise: Secondary | ICD-10-CM

## 2013-08-15 DIAGNOSIS — R42 Dizziness and giddiness: Secondary | ICD-10-CM

## 2013-08-15 DIAGNOSIS — R5383 Other fatigue: Secondary | ICD-10-CM

## 2013-08-15 LAB — CBC WITH DIFFERENTIAL/PLATELET
BASOS ABS: 0 10*3/uL (ref 0.0–0.1)
BASOS PCT: 0.7 % (ref 0.0–3.0)
EOS ABS: 0.1 10*3/uL (ref 0.0–0.7)
Eosinophils Relative: 1.4 % (ref 0.0–5.0)
HCT: 42.4 % (ref 36.0–46.0)
HEMOGLOBIN: 14.5 g/dL (ref 12.0–15.0)
LYMPHS ABS: 1.7 10*3/uL (ref 0.7–4.0)
LYMPHS PCT: 29.4 % (ref 12.0–46.0)
MCHC: 34.2 g/dL (ref 30.0–36.0)
MCV: 95.6 fl (ref 78.0–100.0)
MONO ABS: 0.3 10*3/uL (ref 0.1–1.0)
Monocytes Relative: 5.7 % (ref 3.0–12.0)
NEUTROS ABS: 3.7 10*3/uL (ref 1.4–7.7)
Neutrophils Relative %: 62.8 % (ref 43.0–77.0)
Platelets: 265 10*3/uL (ref 150.0–400.0)
RBC: 4.44 Mil/uL (ref 3.87–5.11)
RDW: 12.8 % (ref 11.5–15.5)
WBC: 5.9 10*3/uL (ref 4.0–10.5)

## 2013-08-15 LAB — BASIC METABOLIC PANEL
BUN: 15 mg/dL (ref 6–23)
CALCIUM: 10.1 mg/dL (ref 8.4–10.5)
CO2: 30 mEq/L (ref 19–32)
CREATININE: 0.8 mg/dL (ref 0.4–1.2)
Chloride: 102 mEq/L (ref 96–112)
GFR: 77.26 mL/min (ref >60.00)
Glucose, Bld: 93 mg/dL (ref 70–99)
Potassium: 4.3 mEq/L (ref 3.5–5.1)
Sodium: 139 mEq/L (ref 135–145)

## 2013-08-15 NOTE — Progress Notes (Signed)
Pre visit review using our clinic review tool, if applicable. No additional management support is needed unless otherwise documented below in the visit note. 

## 2013-08-15 NOTE — Progress Notes (Signed)
  Garret Reddish, MD Phone: [phone]  Subjective:   Jacqueline Molina is a 53 y.o. year old very pleasant female patient who presents with the following:  Fatigue and vertigo and dizziness Patient admits she has had a very stressful period at work recently. She states 4 days ago she woke up and had some mild vertigo reminiscent to when she had a concussion many years ago. This lasted only about an hour then resolved. Since that time, her "head has felt weird". She has a hard time describing it sometimes saying it feels like she is in a tunnel though vision is not affected, sometimes saying her head feels "big" and sometimes saying mild dizziness. This has been persistent and seems to be worse the busier she is. When she rests, she feels much better. Her daughter who is a nurse told her mother she is never inactive and due to inactivity, she should seek out a medical opinion. Symptoms are not worsened by standing quickly. She has been eating and drinking normally.   ROS- continues to have long term issues related to reflux including hoarseness, coughing after eating, chronic sore throat and voice changes (she recently scheduled a follow up with GI to discuss these issues further). Denies headaches or changes in vision. Denies chest pain or shortness of breath. No extremity or facial weakness. No slurring of speech. no fevers, chills,  nausea/vomiting, or recent weight change. No increased stiff neck (history of neck fusion)  Past Medical History- hypertension, GERD, anemia  Medications- reviewed and updated Current Outpatient Prescriptions  Medication Sig Dispense Refill  . ibuprofen (ADVIL,MOTRIN) 200 MG tablet Take 600 mg by mouth once.      . Lansoprazole (PREVACID 24HR PO) Take 1 capsule by mouth daily.      . Multiple Vitamins-Calcium (VIACTIV MULTI-VITAMIN) CHEW Chew by mouth 2 (two) times daily.         No current facility-administered medications for this visit.   Objective: BP  130/90  Pulse 85  Temp(Src) 98.3 F (36.8 C) (Oral)  Wt 173 lb (78.472 kg) Gen: NAD, resting comfortably in chair HEENT: nares normal, TM normal,oropharynx normal without pharyngeal exudate. , Mucous membranes are moist. NCAT. PERRLA.  CV: RRR no mrg  Lungs: CTAB  Abd: soft/nontender/nondistended/normal bowel sounds  MSK: moves all extremities, no edema  Skin: warm and dry, no rash  Neuro: CN II-XII intact, sensation and reflexes normal throughout, 5/5 muscle strength in bilateral upper and lower extremities. Normal finger to nose. Normal rapid alternating movements. Normal Romberg. No ataxia. Normal gait. Dix-hallpike without nystagmus or vertigo.   Assessment/Plan:  Fatigue/dizziness Unclear etiology. Symptoms are vague and difficult to describe. BPPH not likely as vertigo was only for 1 hour. No tinnitus to suggest meniere's.  No new medications or drug causes identified. No palpitations or chest pain or murmur to suggest cardiac cause. Symptoms constant and not worse with standing so doubt orthostatic. No signs or symptoms of infection.  I think most likely patient dealing with stress/fatigue from work and needs to rest. Patient with a normal neurological exam today. Reasonable to check CBC for signs of infection as well as evaluate given history anemia as well as bmet for electrolyte abnormalities. If symptoms persist despite rest (or if they worsen), I have asked patient to follow up with [place] next week.

## 2013-08-15 NOTE — Patient Instructions (Signed)
Not sure of the cause of your fatigue and dizziness/vertigo/head feeling funny. It is possible this is related to overexertion and stress with all you have going on at work.  Your neurological exam was completely normal which is reassuring. We will check some basic labs and call you with results.  I would ask that you follow up with [place] next week if symptoms persist or seek care sooner if symptoms worsen.   Thanks, Dr. Yong Channel

## 2013-10-15 ENCOUNTER — Other Ambulatory Visit: Payer: Self-pay

## 2013-10-15 DIAGNOSIS — Z1231 Encounter for screening mammogram for malignant neoplasm of breast: Secondary | ICD-10-CM

## 2013-10-29 ENCOUNTER — Ambulatory Visit (INDEPENDENT_AMBULATORY_CARE_PROVIDER_SITE_OTHER): Payer: BC Managed Care – PPO | Admitting: Gastroenterology

## 2013-10-29 ENCOUNTER — Encounter: Payer: Self-pay | Admitting: Gastroenterology

## 2013-10-29 VITALS — BP 136/80 | HR 88 | Ht 67.0 in | Wt 168.0 lb

## 2013-10-29 DIAGNOSIS — K219 Gastro-esophageal reflux disease without esophagitis: Secondary | ICD-10-CM

## 2013-10-29 MED ORDER — DEXLANSOPRAZOLE 60 MG PO CPDR: 60.0000 mg | DELAYED_RELEASE_CAPSULE | Freq: Every day | ORAL | Status: DC

## 2013-10-29 NOTE — Progress Notes (Signed)
Review of gastrointestinal problems: 1. Chronic cough.  ? GERD related.  Very rarely has classic GERD symptoms. EGD 07/2007 showed normal esophagus, minor H. pylori negative gastritis.  ENT eval suggested LE reflux, symptoms improved with zegeride.  Pulmonary evaluation suggests no asthma, intrinsic pulmonary causes for cough.  2011 evaluation at [facility], [place] gastroenterology Department including impedance testing concluded that her cough is not likely related to gastroesophageal reflux disease. 2. Routine risk for colon cancer: 03/2010 Colonoscoyp Dr. Ardis Hughs was normal, no polyps; was recommended to have repeat screening colonoscopy in 10 years  HPI: This is a  very pleasant 53 year old woman whom I last saw 2 or 3 years ago. I also care for her husband previously who passed away from esophageal cancer 2-3 years ago.  She has had a long history of cough. Extensively evaluated by pulmonary, ENT, myself as well as gastroenterologist at [facility]. Her cough does not seem to be related to acid, GERD. She does note that her voice has changed recently her cough may have worsened. She started back on Prevacid once daily and may notice a slight improvement night. She is very concerned about cancer development especially given her husband's history. I can understand.      History reviewed. No pertinent past medical history.  Past Surgical History  Procedure Laterality Date  . Neck fusion  C5-C6-C7    12/07  . Breast enhancement surgery      2003  . Bone graft collarbone      2004  . Right knee surgery for bone spur      age 49    Current Outpatient Prescriptions  Medication Sig Dispense Refill  . ibuprofen (ADVIL,MOTRIN) 200 MG tablet Take 600 mg by mouth once.      . Lansoprazole (PREVACID 24HR PO) Take 1 capsule by mouth daily.      . Multiple Vitamins-Calcium (VIACTIV MULTI-VITAMIN) CHEW Chew by mouth 2 (two) times daily.         No current facility-administered  medications for this visit.    Allergies as of 10/29/2013  . (No Known Allergies)    Family History  Problem Relation Age of Onset  . Diabetes Father   . Hypertension Father   . Hyperlipidemia Father   . Dementia Father   . Hypertension Mother     History   Social History  . Marital Status: Married    Spouse Name: N/A    Number of Children: N/A  . Years of Education: N/A   Occupational History  . Not on file.   Social History Main Topics  . Smoking status: Never Smoker   . Smokeless tobacco: Not on file  . Alcohol Use: 0.0 oz/week     Comment: socially on weekends  . Drug Use: Not on file  . Sexual Activity: Not on file   Other Topics Concern  . Not on file   Social History Narrative   Husband died from esophageal cancer 03-01-2008 daughter healthy, four stepdaughters      Physical Exam: There were no vitals taken for this visit. Constitutional: generally well-appearing Psychiatric: alert and oriented x3 Abdomen: soft, nontender, nondistended, no obvious ascites, no peritoneal signs, normal bowel sounds     Assessment and plan: 53 y.o. female with chronic cough, voice change  she has had extensive evaluation essentially showing no clear etiology to her chronic cough. Perhaps 9 acid reflux. It is very stress related as well which makes it more complex  to firmly treat, diagnosis. She is very concerned about development of cancer especially since her husband died of esophageal cancer 3-4 years ago. I would like to proceed with EGD at her soonest convenience. She will also try replacing Prevacid with dexilant.

## 2013-10-29 NOTE — Patient Instructions (Signed)
You have been scheduled for an endoscopy. Please follow written instructions given to you at your visit today. If you use inhalers (even only as needed), please bring them with you on the day of your procedure. Your physician has requested that you go to www.startemmi.com and enter the access code given to you at your visit today. This web site gives a general overview about your procedure. However, you should still follow specific instructions given to you by our office regarding your preparation for the procedure. We have sent the following medications to your pharmacy for you to pick up at your convenience: Dexilant

## 2013-11-04 ENCOUNTER — Encounter: Payer: Self-pay | Admitting: Gastroenterology

## 2013-11-05 ENCOUNTER — Ambulatory Visit
Admission: RE | Admit: 2013-11-05 | Discharge: 2013-11-05 | Disposition: A | Discharge status: Home / Self Care | Payer: BC Managed Care – PPO | Source: Ambulatory Visit

## 2013-11-05 DIAGNOSIS — Z1231 Encounter for screening mammogram for malignant neoplasm of breast: Secondary | ICD-10-CM

## 2013-11-11 ENCOUNTER — Encounter: Payer: Self-pay | Admitting: Gastroenterology

## 2013-11-11 ENCOUNTER — Ambulatory Visit (AMBULATORY_SURGERY_CENTER): Payer: BC Managed Care – PPO | Admitting: Gastroenterology

## 2013-11-11 VITALS — BP 128/91 | HR 80 | Temp 99.2°F | Resp 21 | Ht 67.0 in | Wt 168.0 lb

## 2013-11-11 DIAGNOSIS — R05 Cough: Secondary | ICD-10-CM

## 2013-11-11 DIAGNOSIS — K219 Gastro-esophageal reflux disease without esophagitis: Secondary | ICD-10-CM

## 2013-11-11 DIAGNOSIS — R053 Chronic cough: Secondary | ICD-10-CM

## 2013-11-11 MED ORDER — SODIUM CHLORIDE 0.9 % IV SOLN: 500.0000 mL | INTRAVENOUS | Status: DC

## 2013-11-11 NOTE — Patient Instructions (Signed)
Impressions/recommendations:  Normal exam  Continue once daily Prevacid or Prilosec. Revisit pulmonologist, ENT about chronic cough.  YOU HAD AN ENDOSCOPIC PROCEDURE TODAY AT [facility]: Refer to the procedure report that was given to you for any specific questions about what was found during the examination.  If the procedure report does not answer your questions, please call your gastroenterologist to clarify.  If you requested that your care partner not be given the details of your procedure findings, then the procedure report has been included in a sealed envelope for you to review at your convenience later.  YOU SHOULD EXPECT: Some feelings of bloating in the abdomen. Passage of more gas than usual.  Walking can help get rid of the air that was put into your GI tract during the procedure and reduce the bloating. If you had a lower endoscopy (such as a colonoscopy or flexible sigmoidoscopy) you may notice spotting of blood in your stool or on the toilet paper. If you underwent a bowel prep for your procedure, then you may not have a normal bowel movement for a few days.  DIET: Your first meal following the procedure should be a light meal and then it is ok to progress to your normal diet.  A half-sandwich or bowl of soup is an example of a good first meal.  Heavy or fried foods are harder to digest and may make you feel nauseous or bloated.  Likewise meals heavy in dairy and vegetables can cause extra gas to form and this can also increase the bloating.  Drink plenty of fluids but you should avoid alcoholic beverages for 24 hours.  ACTIVITY: Your care partner should take you home directly after the procedure.  You should plan to take it easy, moving slowly for the rest of the day.  You can resume normal activity the day after the procedure however you should NOT DRIVE or use heavy machinery for 24 hours (because of the sedation medicines used during the test).    SYMPTOMS TO  REPORT IMMEDIATELY: A gastroenterologist can be reached at any hour.  During normal business hours, 8:30 AM to 5:00 PM Monday through Friday, call [phone].  After hours and on weekends, please call the GI answering service at [phone] who will take a message and have the physician on call contact you.   Following upper endoscopy (EGD)  Vomiting of blood or coffee ground material  New chest pain or pain under the shoulder blades  Painful or persistently difficult swallowing  New shortness of breath  Fever of 100F or higher  Black, tarry-looking stools  FOLLOW UP: If any biopsies were taken you will be contacted by phone or by letter within the next 1-3 weeks.  Call your gastroenterologist if you have not heard about the biopsies in 3 weeks.  Our staff will call the home number listed on your records the next business day following your procedure to check on you and address any questions or concerns that you may have at that time regarding the information given to you following your procedure. This is a courtesy call and so if there is no answer at the home number and we have not heard from you through the emergency physician on call, we will assume that you have returned to your regular daily activities without incident.  SIGNATURES/CONFIDENTIALITY: You and/or your care partner have signed paperwork which will be entered into your electronic medical record.  These signatures attest to the fact that  that the information above on your After Visit Summary has been reviewed and is understood.  Full responsibility of the confidentiality of this discharge information lies with you and/or your care-partner.

## 2013-11-11 NOTE — Op Note (Signed)
[place]  [address] [place], [address]   ENDOSCOPY PROCEDURE REPORT  PATIENT: Jacqueline Molina, Jacqueline Molina  MR#: #[id number] BIRTHDATE: September 21, 1960 , 67  yrs. old GENDER: female ENDOSCOPIST: Milus Banister, MD PROCEDURE DATE:  11/11/2013 PROCEDURE:  EGD, diagnostic ASA CLASS:     Class II  INDICATIONS:  Chronic cough.  ? GERD related.  Very rarely has classic GERD symptoms.  EGD 07/2007 showed normal esophagus, minor H.  pylori negative gastritis.  ENT eval suggested LE reflux, symptoms improved with zegeride.  Pulmonary evaluation suggests no asthma, intrinsic pulmonary causes for cough.  2011 evaluation at [facility] [place] Gastroenterology Department including impedance testing concluded that her cough is not likely related to gastroesophageal reflux disease.  2015 symptoms continue, ? a bit worse.  MEDICATIONS: Monitored anesthesia care and Propofol 250 mg IV TOPICAL ANESTHETIC: none  DESCRIPTION OF PROCEDURE: After the risks benefits and alternatives of the procedure were thoroughly explained, informed consent was obtained.  The LB [id number] endoscope was introduced through the mouth and advanced to the second portion of the duodenum , Without limitations.  The instrument was slowly withdrawn as the mucosa was fully examined.   EXAM: The esophagus and gastroesophageal junction were completely normal in appearance.  The stomach was entered and closely examined.The antrum, angularis, and lesser curvature were well visualized, including a retroflexed view of the cardia and fundus. The stomach wall was normally distensable.  The scope passed easily through the pylorus into the duodenum.  Retroflexed views revealed no abnormalities.     The scope was then withdrawn from the patient and the procedure completed.  COMPLICATIONS: There were no immediate complications.  ENDOSCOPIC IMPRESSION: Normal appearing esophagus and GE junction, the stomach was  well visualized and normal in appearance, normal appearing duodenum  RECOMMENDATIONS: Continue once daily PPI (prevacid or prilosec).  Consider revisiting your pulmonologist, ENT about the chronic cough.  eSigned:  Milus Banister, MD 11/11/2013 11:02 AM    CC: Garret Reddish, MD

## 2013-11-11 NOTE — Progress Notes (Signed)
Report to PACU, RN, vss, BBS= Clear.  

## 2013-11-12 ENCOUNTER — Telehealth: Payer: Self-pay

## 2013-11-12 NOTE — Telephone Encounter (Signed)
  Follow up Call-  Call back number 11/11/2013  Post procedure Call Back phone  # [phone] cell  Permission to leave phone message Yes     Patient questions:  Do you have a fever, pain , or abdominal swelling? No. Pain Score  0 *  Have you tolerated food without any problems? Yes.    Have you been able to return to your normal activities? Yes.    Do you have any questions about your discharge instructions: Diet   No. Medications  No. Follow up visit  No.  Do you have questions or concerns about your Care? No.  Actions: * If pain score is 4 or above: No action needed, pain <4.

## 2014-01-17 LAB — HM PAP SMEAR
HM PAP: NORMAL
HM PAP: NORMAL

## 2014-08-20 ENCOUNTER — Encounter: Payer: Self-pay | Admitting: Gastroenterology

## 2015-01-15 ENCOUNTER — Other Ambulatory Visit: Payer: Self-pay

## 2015-01-15 DIAGNOSIS — Z1231 Encounter for screening mammogram for malignant neoplasm of breast: Secondary | ICD-10-CM

## 2015-02-24 ENCOUNTER — Ambulatory Visit: Payer: Self-pay

## 2015-03-03 ENCOUNTER — Ambulatory Visit: Payer: Self-pay | Admitting: Family Medicine

## 2015-03-04 ENCOUNTER — Ambulatory Visit: Payer: Self-pay

## 2015-03-10 ENCOUNTER — Ambulatory Visit
Admission: RE | Admit: 2015-03-10 | Discharge: 2015-03-10 | Disposition: A | Discharge status: Home / Self Care | Payer: BLUE CROSS/BLUE SHIELD | Source: Ambulatory Visit

## 2015-03-10 DIAGNOSIS — Z1231 Encounter for screening mammogram for malignant neoplasm of breast: Secondary | ICD-10-CM

## 2015-04-22 ENCOUNTER — Ambulatory Visit (INDEPENDENT_AMBULATORY_CARE_PROVIDER_SITE_OTHER): Payer: BLUE CROSS/BLUE SHIELD | Admitting: Family Medicine

## 2015-04-22 ENCOUNTER — Other Ambulatory Visit: Payer: Self-pay | Admitting: Family Medicine

## 2015-04-22 ENCOUNTER — Encounter: Payer: Self-pay | Admitting: Family Medicine

## 2015-04-22 VITALS — BP 122/80 | HR 90 | Temp 98.7°F | Wt 176.0 lb

## 2015-04-22 DIAGNOSIS — D539 Nutritional anemia, unspecified: Secondary | ICD-10-CM | POA: Diagnosis not present

## 2015-04-22 DIAGNOSIS — C4490 Unspecified malignant neoplasm of skin, unspecified: Secondary | ICD-10-CM | POA: Diagnosis not present

## 2015-04-22 DIAGNOSIS — Z0000 Encounter for general adult medical examination without abnormal findings: Secondary | ICD-10-CM

## 2015-04-22 DIAGNOSIS — K219 Gastro-esophageal reflux disease without esophagitis: Secondary | ICD-10-CM

## 2015-04-22 DIAGNOSIS — IMO0001 Reserved for inherently not codable concepts without codable children: Secondary | ICD-10-CM

## 2015-04-22 DIAGNOSIS — R030 Elevated blood-pressure reading, without diagnosis of hypertension: Secondary | ICD-10-CM

## 2015-04-22 LAB — LIPID PANEL
CHOL/HDL RATIO: 2
Cholesterol: 192 mg/dL (ref 0–200)
HDL: 87.2 mg/dL (ref >39.00)
LDL CALC: 92 mg/dL (ref 0–99)
NonHDL: 104.7
Triglycerides: 63 mg/dL (ref 0.0–149.0)
VLDL: 12.6 mg/dL (ref 0.0–40.0)

## 2015-04-22 LAB — CBC
HCT: 40.6 % (ref 36.0–46.0)
Hemoglobin: 13.8 g/dL (ref 12.0–15.0)
MCHC: 33.9 g/dL (ref 30.0–36.0)
MCV: 95.3 fl (ref 78.0–100.0)
Platelets: 271 10*3/uL (ref 150.0–400.0)
RBC: 4.26 Mil/uL (ref 3.87–5.11)
RDW: 13.1 % (ref 11.5–15.5)
WBC: 4.8 10*3/uL (ref 4.0–10.5)

## 2015-04-22 LAB — COMPREHENSIVE METABOLIC PANEL
ALBUMIN: 4.7 g/dL (ref 3.5–5.2)
ALT: 27 U/L (ref 0–35)
AST: 24 U/L (ref 0–37)
Alkaline Phosphatase: 76 U/L (ref 39–117)
BUN: 17 mg/dL (ref 6–23)
CO2: 29 meq/L (ref 19–32)
Calcium: 9.7 mg/dL (ref 8.4–10.5)
Chloride: 104 mEq/L (ref 96–112)
Creatinine, Ser: 0.8 mg/dL (ref 0.40–1.20)
GFR: 78.99 mL/min (ref >60.00)
Glucose, Bld: 113 mg/dL — ABNORMAL HIGH (ref 70–99)
Potassium: 4.6 mEq/L (ref 3.5–5.1)
SODIUM: 142 meq/L (ref 135–145)
Total Bilirubin: 0.6 mg/dL (ref 0.2–1.2)
Total Protein: 7.4 g/dL (ref 6.0–8.3)

## 2015-04-22 LAB — TSH: TSH: 1.2 u[IU]/mL (ref 0.35–4.50)

## 2015-04-22 NOTE — Patient Instructions (Addendum)
Sign release of information at the check out desk for records from your last pap smear  Labs before you go  I think you are doing great
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
## 2015-04-23 LAB — HIV ANTIBODY (ROUTINE TESTING W REFLEX): HIV 1&2 Ab, 4th Generation: NONREACTIVE

## 2015-04-23 LAB — HEPATITIS C ANTIBODY: HCV Ab: NEGATIVE

## 2015-05-01 ENCOUNTER — Encounter: Payer: Self-pay | Admitting: Family Medicine

## 2015-05-05 ENCOUNTER — Encounter: Payer: Self-pay | Admitting: Family Medicine

## 2015-06-01 DIAGNOSIS — C44722 Squamous cell carcinoma of skin of right lower limb, including hip: Secondary | ICD-10-CM | POA: Diagnosis not present

## 2015-06-15 DIAGNOSIS — H5212 Myopia, left eye: Secondary | ICD-10-CM | POA: Diagnosis not present

## 2015-06-15 DIAGNOSIS — H5201 Hypermetropia, right eye: Secondary | ICD-10-CM | POA: Diagnosis not present

## 2015-06-15 DIAGNOSIS — H11131 Conjunctival pigmentations, right eye: Secondary | ICD-10-CM | POA: Diagnosis not present

## 2015-08-25 DIAGNOSIS — M1711 Unilateral primary osteoarthritis, right knee: Secondary | ICD-10-CM | POA: Diagnosis not present

## 2015-08-25 DIAGNOSIS — S83281A Other tear of lateral meniscus, current injury, right knee, initial encounter: Secondary | ICD-10-CM | POA: Diagnosis not present

## 2015-10-08 DIAGNOSIS — S46012A Strain of muscle(s) and tendon(s) of the rotator cuff of left shoulder, initial encounter: Secondary | ICD-10-CM | POA: Diagnosis not present

## 2015-10-15 DIAGNOSIS — M25512 Pain in left shoulder: Secondary | ICD-10-CM | POA: Diagnosis not present

## 2015-10-29 DIAGNOSIS — M25512 Pain in left shoulder: Secondary | ICD-10-CM | POA: Diagnosis not present

## 2015-11-19 DIAGNOSIS — S42255A Nondisplaced fracture of greater tuberosity of left humerus, initial encounter for closed fracture: Secondary | ICD-10-CM | POA: Diagnosis not present

## 2015-12-03 DIAGNOSIS — S42255D Nondisplaced fracture of greater tuberosity of left humerus, subsequent encounter for fracture with routine healing: Secondary | ICD-10-CM | POA: Diagnosis not present

## 2015-12-03 DIAGNOSIS — M25512 Pain in left shoulder: Secondary | ICD-10-CM | POA: Diagnosis not present

## 2015-12-03 DIAGNOSIS — M25612 Stiffness of left shoulder, not elsewhere classified: Secondary | ICD-10-CM | POA: Diagnosis not present

## 2015-12-10 DIAGNOSIS — M25612 Stiffness of left shoulder, not elsewhere classified: Secondary | ICD-10-CM | POA: Diagnosis not present

## 2015-12-10 DIAGNOSIS — M25512 Pain in left shoulder: Secondary | ICD-10-CM | POA: Diagnosis not present

## 2015-12-10 DIAGNOSIS — R531 Weakness: Secondary | ICD-10-CM | POA: Diagnosis not present

## 2015-12-11 DIAGNOSIS — S42255D Nondisplaced fracture of greater tuberosity of left humerus, subsequent encounter for fracture with routine healing: Secondary | ICD-10-CM | POA: Diagnosis not present

## 2015-12-11 DIAGNOSIS — M25512 Pain in left shoulder: Secondary | ICD-10-CM | POA: Diagnosis not present

## 2015-12-11 DIAGNOSIS — M25612 Stiffness of left shoulder, not elsewhere classified: Secondary | ICD-10-CM | POA: Diagnosis not present

## 2015-12-17 DIAGNOSIS — H11131 Conjunctival pigmentations, right eye: Secondary | ICD-10-CM | POA: Diagnosis not present

## 2015-12-17 DIAGNOSIS — M25512 Pain in left shoulder: Secondary | ICD-10-CM | POA: Diagnosis not present

## 2015-12-17 DIAGNOSIS — M25612 Stiffness of left shoulder, not elsewhere classified: Secondary | ICD-10-CM | POA: Diagnosis not present

## 2015-12-17 DIAGNOSIS — S42255D Nondisplaced fracture of greater tuberosity of left humerus, subsequent encounter for fracture with routine healing: Secondary | ICD-10-CM | POA: Diagnosis not present

## 2015-12-18 DIAGNOSIS — M25612 Stiffness of left shoulder, not elsewhere classified: Secondary | ICD-10-CM | POA: Diagnosis not present

## 2015-12-18 DIAGNOSIS — S42255D Nondisplaced fracture of greater tuberosity of left humerus, subsequent encounter for fracture with routine healing: Secondary | ICD-10-CM | POA: Diagnosis not present

## 2015-12-18 DIAGNOSIS — M25512 Pain in left shoulder: Secondary | ICD-10-CM | POA: Diagnosis not present

## 2015-12-22 DIAGNOSIS — S42255D Nondisplaced fracture of greater tuberosity of left humerus, subsequent encounter for fracture with routine healing: Secondary | ICD-10-CM | POA: Diagnosis not present

## 2015-12-22 DIAGNOSIS — M25512 Pain in left shoulder: Secondary | ICD-10-CM | POA: Diagnosis not present

## 2015-12-22 DIAGNOSIS — M25612 Stiffness of left shoulder, not elsewhere classified: Secondary | ICD-10-CM | POA: Diagnosis not present

## 2015-12-31 DIAGNOSIS — M25612 Stiffness of left shoulder, not elsewhere classified: Secondary | ICD-10-CM | POA: Diagnosis not present

## 2015-12-31 DIAGNOSIS — S42255D Nondisplaced fracture of greater tuberosity of left humerus, subsequent encounter for fracture with routine healing: Secondary | ICD-10-CM | POA: Diagnosis not present

## 2015-12-31 DIAGNOSIS — M25512 Pain in left shoulder: Secondary | ICD-10-CM | POA: Diagnosis not present

## 2016-01-14 DIAGNOSIS — Z113 Encounter for screening for infections with a predominantly sexual mode of transmission: Secondary | ICD-10-CM | POA: Diagnosis not present

## 2016-01-14 DIAGNOSIS — Z01419 Encounter for gynecological examination (general) (routine) without abnormal findings: Secondary | ICD-10-CM | POA: Diagnosis not present

## 2016-01-14 DIAGNOSIS — Z6828 Body mass index (BMI) 28.0-28.9, adult: Secondary | ICD-10-CM | POA: Diagnosis not present

## 2016-01-14 DIAGNOSIS — Z1151 Encounter for screening for human papillomavirus (HPV): Secondary | ICD-10-CM | POA: Diagnosis not present

## 2016-02-12 DIAGNOSIS — Z23 Encounter for immunization: Secondary | ICD-10-CM | POA: Diagnosis not present

## 2016-02-29 ENCOUNTER — Other Ambulatory Visit: Payer: Self-pay | Admitting: Obstetrics & Gynecology

## 2016-02-29 DIAGNOSIS — Z1231 Encounter for screening mammogram for malignant neoplasm of breast: Secondary | ICD-10-CM

## 2016-04-19 DIAGNOSIS — D2272 Melanocytic nevi of left lower limb, including hip: Secondary | ICD-10-CM | POA: Diagnosis not present

## 2016-04-19 DIAGNOSIS — Z85828 Personal history of other malignant neoplasm of skin: Secondary | ICD-10-CM | POA: Diagnosis not present

## 2016-04-19 DIAGNOSIS — D2371 Other benign neoplasm of skin of right lower limb, including hip: Secondary | ICD-10-CM | POA: Diagnosis not present

## 2016-04-19 DIAGNOSIS — D2271 Melanocytic nevi of right lower limb, including hip: Secondary | ICD-10-CM | POA: Diagnosis not present

## 2016-04-19 DIAGNOSIS — L821 Other seborrheic keratosis: Secondary | ICD-10-CM | POA: Diagnosis not present

## 2016-05-05 ENCOUNTER — Ambulatory Visit: Payer: BLUE CROSS/BLUE SHIELD

## 2016-06-02 ENCOUNTER — Ambulatory Visit
Admission: RE | Admit: 2016-06-02 | Discharge: 2016-06-02 | Disposition: A | Discharge status: Home / Self Care | Payer: BLUE CROSS/BLUE SHIELD | Source: Ambulatory Visit | Attending: Obstetrics & Gynecology | Admitting: Obstetrics & Gynecology

## 2016-06-02 DIAGNOSIS — Z1231 Encounter for screening mammogram for malignant neoplasm of breast: Secondary | ICD-10-CM | POA: Diagnosis not present

## 2016-06-13 DIAGNOSIS — M1711 Unilateral primary osteoarthritis, right knee: Secondary | ICD-10-CM | POA: Diagnosis not present

## 2016-07-19 DIAGNOSIS — H04123 Dry eye syndrome of bilateral lacrimal glands: Secondary | ICD-10-CM | POA: Diagnosis not present

## 2016-07-28 DIAGNOSIS — S83242A Other tear of medial meniscus, current injury, left knee, initial encounter: Secondary | ICD-10-CM | POA: Diagnosis not present

## 2016-10-31 ENCOUNTER — Ambulatory Visit (INDEPENDENT_AMBULATORY_CARE_PROVIDER_SITE_OTHER): Payer: BLUE CROSS/BLUE SHIELD | Admitting: Physician Assistant

## 2016-10-31 ENCOUNTER — Encounter: Payer: Self-pay | Admitting: Physician Assistant

## 2016-10-31 VITALS — BP 140/90 | HR 89 | Temp 98.7°F | Ht 67.0 in | Wt 177.0 lb

## 2016-10-31 DIAGNOSIS — J069 Acute upper respiratory infection, unspecified: Secondary | ICD-10-CM

## 2016-10-31 MED ORDER — DOXYCYCLINE HYCLATE 100 MG PO TABS: 100.0000 mg | ORAL_TABLET | Freq: Two times a day (BID) | ORAL | 0 refills | Status: DC

## 2016-10-31 MED ORDER — BENZONATATE 100 MG PO CAPS: 100.0000 mg | ORAL_CAPSULE | Freq: Three times a day (TID) | ORAL | 0 refills | Status: DC | PRN

## 2016-10-31 MED ORDER — METHYLPREDNISOLONE 4 MG PO TBPK: ORAL_TABLET | ORAL | 0 refills | Status: DC
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
## 2016-10-31 NOTE — Patient Instructions (Signed)
It was great to meet you!  Usual Directions for Use Directions for Medrol Dosepak (4 mg tablets): Day 1: Two tablets before breakfast, one after lunch, one after dinner, and two at bedtime. If started late in the day, take all six tablets at once or divide into two or three doses, unless otherwise directed by prescriber. Day 2: One tablet before breakfast, one after lunch, one after dinner, and two at bedtime Day 3: One tablet before breakfast, one after lunch, one after dinner, and one at bedtime Day 4: One tablet before breakfast, one after lunch, and one at bedtime Day 5: One tablet before breakfast and one at bedtime Day 6: One tablet before breakfast   Please let us know if your symptoms worsen or do not improve.

## 2016-11-23 ENCOUNTER — Ambulatory Visit (INDEPENDENT_AMBULATORY_CARE_PROVIDER_SITE_OTHER): Payer: BLUE CROSS/BLUE SHIELD | Admitting: Family Medicine

## 2016-11-23 ENCOUNTER — Encounter: Payer: Self-pay | Admitting: Family Medicine

## 2016-11-23 VITALS — BP 130/72 | HR 79 | Ht 67.0 in | Wt 176.0 lb

## 2016-11-23 DIAGNOSIS — Z23 Encounter for immunization: Secondary | ICD-10-CM

## 2016-11-23 DIAGNOSIS — Z79899 Other long term (current) drug therapy: Secondary | ICD-10-CM

## 2016-11-23 DIAGNOSIS — Z0000 Encounter for general adult medical examination without abnormal findings: Secondary | ICD-10-CM

## 2016-11-23 DIAGNOSIS — R739 Hyperglycemia, unspecified: Secondary | ICD-10-CM

## 2016-11-23 DIAGNOSIS — E663 Overweight: Secondary | ICD-10-CM

## 2016-11-23 DIAGNOSIS — K219 Gastro-esophageal reflux disease without esophagitis: Secondary | ICD-10-CM

## 2016-11-23 LAB — COMPREHENSIVE METABOLIC PANEL
ALK PHOS: 74 U/L (ref 39–117)
ALT: 19 U/L (ref 0–35)
AST: 22 U/L (ref 0–37)
Albumin: 4.6 g/dL (ref 3.5–5.2)
BILIRUBIN TOTAL: 0.7 mg/dL (ref 0.2–1.2)
BUN: 16 mg/dL (ref 6–23)
CO2: 29 mEq/L (ref 19–32)
Calcium: 10 mg/dL (ref 8.4–10.5)
Chloride: 103 mEq/L (ref 96–112)
Creatinine, Ser: 0.76 mg/dL (ref 0.40–1.20)
GFR: 83.33 mL/min (ref >60.00)
GLUCOSE: 105 mg/dL — AB (ref 70–99)
Potassium: 4.7 mEq/L (ref 3.5–5.1)
Sodium: 139 mEq/L (ref 135–145)
TOTAL PROTEIN: 7.2 g/dL (ref 6.0–8.3)

## 2016-11-23 LAB — HEMOGLOBIN A1C: Hgb A1c MFr Bld: 5.7 % (ref 4.6–6.5)

## 2016-11-23 LAB — CBC
HCT: 42.4 % (ref 36.0–46.0)
Hemoglobin: 14.1 g/dL (ref 12.0–15.0)
MCHC: 33.2 g/dL (ref 30.0–36.0)
MCV: 99.3 fl (ref 78.0–100.0)
Platelets: 234 10*3/uL (ref 150.0–400.0)
RBC: 4.27 Mil/uL (ref 3.87–5.11)
RDW: 12.7 % (ref 11.5–15.5)
WBC: 4 10*3/uL (ref 4.0–10.5)

## 2016-11-23 LAB — LIPID PANEL
CHOLESTEROL: 193 mg/dL (ref 0–200)
HDL: 92.3 mg/dL (ref >39.00)
LDL Cholesterol: 89 mg/dL (ref 0–99)
NonHDL: 100.35
TRIGLYCERIDES: 58 mg/dL (ref 0.0–149.0)
Total CHOL/HDL Ratio: 2
VLDL: 11.6 mg/dL (ref 0.0–40.0)

## 2016-11-23 LAB — VITAMIN B12: VITAMIN B 12: 1014 pg/mL — AB (ref 211–911)
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
## 2016-11-23 NOTE — Patient Instructions (Signed)
Love your goal of 10 lbs down. May help you hold out longer on the knee.   Checking in on blood sugar today with 2 different tests.   Meant to mention blood pressure looks great  Please stop by lab before you go

## 2016-11-23 NOTE — Addendum Note (Signed)
Addended by: Mariam Dollar, Roselyn Reef M on: 11/23/2016 01:10 PM   Modules accepted: Orders

## 2016-12-29 ENCOUNTER — Encounter (HOSPITAL_COMMUNITY): Payer: Self-pay | Admitting: Emergency Medicine

## 2016-12-29 ENCOUNTER — Ambulatory Visit: Payer: Self-pay | Admitting: *Deleted

## 2016-12-29 ENCOUNTER — Observation Stay (HOSPITAL_COMMUNITY)
Admission: EM | Admit: 2016-12-29 | Discharge: 2016-12-30 | Disposition: A | Discharge status: Home / Self Care | Payer: BLUE CROSS/BLUE SHIELD | Attending: Internal Medicine | Admitting: Internal Medicine

## 2016-12-29 ENCOUNTER — Other Ambulatory Visit: Payer: Self-pay

## 2016-12-29 DIAGNOSIS — Z885 Allergy status to narcotic agent status: Secondary | ICD-10-CM | POA: Insufficient documentation

## 2016-12-29 DIAGNOSIS — Z981 Arthrodesis status: Secondary | ICD-10-CM | POA: Diagnosis not present

## 2016-12-29 DIAGNOSIS — Z85828 Personal history of other malignant neoplasm of skin: Secondary | ICD-10-CM | POA: Insufficient documentation

## 2016-12-29 DIAGNOSIS — N281 Cyst of kidney, acquired: Secondary | ICD-10-CM | POA: Diagnosis not present

## 2016-12-29 DIAGNOSIS — K922 Gastrointestinal hemorrhage, unspecified: Secondary | ICD-10-CM | POA: Diagnosis present

## 2016-12-29 DIAGNOSIS — K625 Hemorrhage of anus and rectum: Secondary | ICD-10-CM | POA: Diagnosis not present

## 2016-12-29 DIAGNOSIS — K760 Fatty (change of) liver, not elsewhere classified: Secondary | ICD-10-CM | POA: Diagnosis not present

## 2016-12-29 DIAGNOSIS — K7689 Other specified diseases of liver: Secondary | ICD-10-CM | POA: Insufficient documentation

## 2016-12-29 DIAGNOSIS — K219 Gastro-esophageal reflux disease without esophagitis: Secondary | ICD-10-CM | POA: Insufficient documentation

## 2016-12-29 HISTORY — DX: Nausea with vomiting, unspecified: R11.2

## 2016-12-29 HISTORY — DX: Reserved for concepts with insufficient information to code with codable children: IMO0002

## 2016-12-29 HISTORY — DX: Family history of other specified conditions: Z84.89

## 2016-12-29 HISTORY — DX: Melena: K92.1

## 2016-12-29 HISTORY — DX: Other gastritis without bleeding: K29.60

## 2016-12-29 HISTORY — DX: Basal cell carcinoma of skin, unspecified: C44.91

## 2016-12-29 HISTORY — DX: Unspecified osteoarthritis, unspecified site: M19.90

## 2016-12-29 HISTORY — DX: Noninfective gastroenteritis and colitis, unspecified: K52.9

## 2016-12-29 HISTORY — DX: Hemorrhage of anus and rectum: K62.5

## 2016-12-29 HISTORY — DX: Other specified postprocedural states: Z98.890

## 2016-12-29 LAB — COMPREHENSIVE METABOLIC PANEL
ALBUMIN: 4.3 g/dL (ref 3.5–5.0)
ALK PHOS: 85 U/L (ref 38–126)
ALT: 25 U/L (ref 14–54)
ANION GAP: 7 (ref 5–15)
AST: 27 U/L (ref 15–41)
BUN: 9 mg/dL (ref 6–20)
CHLORIDE: 105 mmol/L (ref 101–111)
CO2: 25 mmol/L (ref 22–32)
Calcium: 10.4 mg/dL — ABNORMAL HIGH (ref 8.9–10.3)
Creatinine, Ser: 0.7 mg/dL (ref 0.44–1.00)
GFR calc non Af Amer: >60 mL/min (ref >60)
Glucose, Bld: 145 mg/dL — ABNORMAL HIGH (ref 65–99)
POTASSIUM: 3.5 mmol/L (ref 3.5–5.1)
SODIUM: 137 mmol/L (ref 135–145)
TOTAL PROTEIN: 7.1 g/dL (ref 6.5–8.1)
Total Bilirubin: 0.8 mg/dL (ref 0.3–1.2)

## 2016-12-29 LAB — CBC
HEMATOCRIT: 41.6 % (ref 36.0–46.0)
HEMOGLOBIN: 13.9 g/dL (ref 12.0–15.0)
MCH: 32.4 pg (ref 26.0–34.0)
MCHC: 33.4 g/dL (ref 30.0–36.0)
MCV: 97 fL (ref 78.0–100.0)
Platelets: 242 10*3/uL (ref 150–400)
RBC: 4.29 MIL/uL (ref 3.87–5.11)
RDW: 12.7 % (ref 11.5–15.5)
WBC: 10.5 10*3/uL (ref 4.0–10.5)

## 2016-12-29 LAB — TYPE AND SCREEN
ABO/RH(D): A POS
ANTIBODY SCREEN: NEGATIVE

## 2016-12-29 LAB — PROTIME-INR
INR: 0.94
Prothrombin Time: 12.5 seconds (ref 11.4–15.2)

## 2016-12-29 LAB — POC OCCULT BLOOD, ED: Fecal Occult Bld: POSITIVE — AB

## 2016-12-29 LAB — ABO/RH: ABO/RH(D): A POS

## 2016-12-29 MED ORDER — ONDANSETRON HCL 4 MG/2ML IJ SOLN
4.0000 mg | Freq: Four times a day (QID) | INTRAMUSCULAR | Status: DC | PRN
  Administered 2016-12-29: 4 mg via INTRAVENOUS
  Filled 2016-12-29: qty 2

## 2016-12-29 MED ORDER — PANTOPRAZOLE SODIUM 40 MG IV SOLR
40.0000 mg | Freq: Two times a day (BID) | INTRAVENOUS | Status: DC
  Administered 2016-12-29 – 2016-12-30 (×2): 40 mg via INTRAVENOUS
  Filled 2016-12-29: qty 40

## 2016-12-29 MED ORDER — ACETAMINOPHEN 650 MG RE SUPP: 650.0000 mg | Freq: Four times a day (QID) | RECTAL | Status: DC | PRN

## 2016-12-29 MED ORDER — ONDANSETRON HCL 4 MG PO TABS: 4.0000 mg | ORAL_TABLET | Freq: Four times a day (QID) | ORAL | Status: DC | PRN

## 2016-12-29 MED ORDER — ACETAMINOPHEN 325 MG PO TABS
650.0000 mg | ORAL_TABLET | Freq: Four times a day (QID) | ORAL | Status: DC | PRN
  Administered 2016-12-29 – 2016-12-30 (×2): 650 mg via ORAL
  Filled 2016-12-29 (×2): qty 2
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
## 2016-12-29 NOTE — Telephone Encounter (Signed)
FYI patient has arrived at ED
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
## 2016-12-29 NOTE — Telephone Encounter (Signed)
   Reason for Disposition . [1] MODERATE rectal bleeding (small blood clots, passing blood without stool, or toilet water turns red) AND [2] more than once a day  Answer Assessment - Initial Assessment Questions 1. APPEARANCE of BLOOD: "What color is it?" "Is it passed separately, on the surface of the stool, or mixed in with the stool?"      Blood on tissue this morning- now patient is dripping blood when sits on toilet 2. AMOUNT: "How much blood was passed?"      Clots in toilet and small drips 3. FREQUENCY: "How many times has blood been passed with the stools?"      3 4. ONSET: "When was the blood first seen in the stools?" (Days or weeks)      This morning 5. DIARRHEA: "Is there also some diarrhea?" If so, ask: "How many diarrhea stools were passed in past 24 hours?"      Diarrhea early am-3:30 4 episodes after that she has dripped clots and blood 6. CONSTIPATION: "Do you have constipation?" If so, "How bad is it?"     no 7. RECURRENT SYMPTOMS: "Have you had blood in your stools before?" If so, ask: "When was the last time?" and "What happened that time?"      no 8. BLOOD THINNERS: "Do you take any blood thinners?" (e.g., Coumadin/warfarin, Pradaxa/dabigatran, aspirin)     no 9. OTHER SYMPTOMS: "Do you have any other symptoms?"  (e.g., abdominal pain, vomiting, dizziness, fever)     Abdominal cramping and did have dizziness and sweating during diarrhea episode 10. PREGNANCY: "Is there any chance you are pregnant?" "When was your last menstrual period?"       n/a  Protocols used: RECTAL BLEEDING-A-AH

## 2016-12-29 NOTE — Telephone Encounter (Signed)
Hey team,   I had a 4 pm opening today. We could have done stat hemoglobin/CBC on her to make sure she was ok to monitor at home. Rectal bleeds do not always require hospitalization/ED visit. Could even be a hemorrhoid. Would be great if something like this could be urgently communicated with physician.   Garret Reddish

## 2016-12-29 NOTE — ED Triage Notes (Signed)
Pt states she woke up with abd pain, started having diarrhea. Pt states abd cramping has happened when she eats pork; which she ate last night. Pt then started having bloody stools since 5 this morning. Denies N/V. Denies lightheadedness.

## 2016-12-30 ENCOUNTER — Observation Stay (HOSPITAL_COMMUNITY): Payer: BLUE CROSS/BLUE SHIELD

## 2016-12-30 ENCOUNTER — Other Ambulatory Visit: Payer: Self-pay

## 2016-12-30 ENCOUNTER — Encounter (HOSPITAL_COMMUNITY): Payer: Self-pay | Admitting: Physician Assistant

## 2016-12-30 DIAGNOSIS — K219 Gastro-esophageal reflux disease without esophagitis: Secondary | ICD-10-CM | POA: Diagnosis not present

## 2016-12-30 DIAGNOSIS — R05 Cough: Secondary | ICD-10-CM | POA: Diagnosis not present

## 2016-12-30 DIAGNOSIS — K921 Melena: Secondary | ICD-10-CM

## 2016-12-30 DIAGNOSIS — N281 Cyst of kidney, acquired: Secondary | ICD-10-CM | POA: Diagnosis not present

## 2016-12-30 DIAGNOSIS — K922 Gastrointestinal hemorrhage, unspecified: Secondary | ICD-10-CM | POA: Diagnosis not present

## 2016-12-30 DIAGNOSIS — R197 Diarrhea, unspecified: Secondary | ICD-10-CM

## 2016-12-30 DIAGNOSIS — R1084 Generalized abdominal pain: Secondary | ICD-10-CM

## 2016-12-30 DIAGNOSIS — K625 Hemorrhage of anus and rectum: Secondary | ICD-10-CM | POA: Diagnosis not present

## 2016-12-30 HISTORY — DX: Melena: K92.1

## 2016-12-30 HISTORY — DX: Hemorrhage of anus and rectum: K62.5

## 2016-12-30 LAB — HIV ANTIBODY (ROUTINE TESTING W REFLEX): HIV Screen 4th Generation wRfx: NONREACTIVE

## 2016-12-30 LAB — CBC
HEMATOCRIT: 38.9 % (ref 36.0–46.0)
HEMOGLOBIN: 13.1 g/dL (ref 12.0–15.0)
MCH: 32.8 pg (ref 26.0–34.0)
MCHC: 33.7 g/dL (ref 30.0–36.0)
MCV: 97.3 fL (ref 78.0–100.0)
Platelets: 206 10*3/uL (ref 150–400)
RBC: 4 MIL/uL (ref 3.87–5.11)
RDW: 12.7 % (ref 11.5–15.5)
WBC: 9.1 10*3/uL (ref 4.0–10.5)

## 2016-12-30 MED ORDER — PANTOPRAZOLE SODIUM 40 MG PO TBEC
40.0000 mg | DELAYED_RELEASE_TABLET | Freq: Every day | ORAL | Status: DC
  Administered 2016-12-30: 40 mg via ORAL
  Filled 2016-12-30: qty 1

## 2016-12-30 MED ORDER — IOPAMIDOL (ISOVUE-300) INJECTION 61%
INTRAVENOUS | Status: AC
  Administered 2016-12-30: 100 mL
  Filled 2016-12-30: qty 30

## 2016-12-30 MED ORDER — IOPAMIDOL (ISOVUE-300) INJECTION 61%
INTRAVENOUS | Status: AC
  Filled 2016-12-30: qty 100

## 2016-12-30 NOTE — ED Notes (Signed)
Pt called out asking for Tylenol.  After assessing, patient states the rectal bleeding had stopped approx 3am.  Pt states at 5:30 she was woken up by abdominal cramping, and then approx 6am she went to the restroom and noted bright red blood in her stool.  Patient now c/o 6/10 abdominal pain, provided Tylenol, per request.

## 2016-12-30 NOTE — Telephone Encounter (Signed)
The patient has been admitted to the hospital.

## 2016-12-30 NOTE — Progress Notes (Signed)
PROGRESS NOTE    Jacqueline Molina  NOB:[id number] DOB: 12-19-1960 DOA: 12/29/2016 PCP: Marin Olp, MD   Chief Complaint  Patient presents with  . GI Bleeding    Brief Narrative:  HPI On 12/29/2016 by Dr. Jennette Kettle Jacqueline Molina is a 56 y.o. female with medical history significant of GERD.  Patient presents to the ED with c/o hematochezia and associated lower abdominal crampy pain.  Had loose stool this AM, but bleeding onset at 2pm today.  5 episodes or so thus far since arrival to ED.  Symptoms constant, not really improving.  Quality is bright red, with clots.  Not on blood thinners.  No history of same.  Colonoscopy 7 years ago, doesn't remember any mention of diverticulosis.  Assessment & Plan   Bright red blood per rectum/GI Bleeding -suspect colitis vs diverticular bleed-patient states she had abdominal pain and diarrhea prior to bleeding episodes  -hemoglobin has remained stable, currently 13.1 -FOBT + -patient EGD in 2015 which was normal -Colonoscopy in 2012 normal -placed on protonix -Gastroenterology consulted and appreciated, and will obtain CT abdomen and possible supportive care for now -CT abd/pelvis with contrast: Severe inflammatory or infectious colitis involving the splenic flexure and descending colon. Ischemic colitis would be another possibility given the distribution but all major vessels look normal. Mild diffuse fatty infiltration of liver, simple right hepatic lobe cyst. -given that patient is afebrile with no leukocytosis, will continue to monitor and not start antibiotics at this time- pending further recommendations from GI  DVT Prophylaxis  SCDs  Code Status: Full  Family Communication: Daughter at bedside  Disposition Plan: Observation. Dispo to home when stable  Consultants Gastronenterology  Procedures  None  Antibiotics   Anti-infectives (From admission, onward)   None      Subjective:   Jacqueline Molina seen and  examined today. Denies current abdominal pain, nausea, vomiting, diarrhea. Has had a few more bloody bowel movements today. Denies chest pain, shortness of breath, dizziness, headache.   Objective:   Vitals:   12/30/16 1000 12/30/16 1001 12/30/16 1100 12/30/16 1200  BP:  124/89 118/84 124/83  Pulse: 91 93 91 89  Resp: 17 16 17 18   Temp:      TempSrc:      SpO2: 97% 97% 97% 97%  Weight:      Height:       No intake or output data in the 24 hours ending 12/30/16 1506 Filed Weights   12/29/16 1633  Weight: 76.2 kg (168 lb)    Exam  General: Well developed, well nourished, NAD, appears stated age  HEENT: NCAT, mucous membranes moist.   Cardiovascular: S1 S2 auscultated, no rubs, murmurs or gallops. Regular rate and rhythm.  Respiratory: Clear to auscultation bilaterally with equal chest rise  Abdomen: Soft, nontender, nondistended, + bowel sounds  Extremities: warm dry without cyanosis clubbing or edema  Neuro: AAOx3, nonfocal  Psych: Normal affect and demeanor with intact judgement and insight   Data Reviewed: I have personally reviewed following labs and imaging studies  CBC: Recent Labs  Lab 12/29/16 1636 12/30/16 0339  WBC 10.5 9.1  HGB 13.9 13.1  HCT 41.6 38.9  MCV 97.0 97.3  PLT 242 947   Basic Metabolic Panel: Recent Labs  Lab 12/29/16 1636  NA 137  K 3.5  CL 105  CO2 25  GLUCOSE 145*  BUN 9  CREATININE 0.70  CALCIUM 10.4*   GFR: Estimated Creatinine Clearance: 82.6 mL/min (by C-G formula based  on SCr of 0.7 mg/dL). Liver Function Tests: Recent Labs  Lab 12/29/16 1636  AST 27  ALT 25  ALKPHOS 85  BILITOT 0.8  PROT 7.1  ALBUMIN 4.3   No results for input(s): LIPASE, AMYLASE in the last 168 hours. No results for input(s): AMMONIA in the last 168 hours. Coagulation Profile: Recent Labs  Lab 12/29/16 2231  INR 0.94   Cardiac Enzymes: No results for input(s): CKTOTAL, CKMB, CKMBINDEX, TROPONINI in the last 168 hours. BNP (last 3  results) No results for input(s): PROBNP in the last 8760 hours. HbA1C: No results for input(s): HGBA1C in the last 72 hours. CBG: No results for input(s): GLUCAP in the last 168 hours. Lipid Profile: No results for input(s): CHOL, HDL, LDLCALC, TRIG, CHOLHDL, LDLDIRECT in the last 72 hours. Thyroid Function Tests: No results for input(s): TSH, T4TOTAL, FREET4, T3FREE, THYROIDAB in the last 72 hours. Anemia Panel: No results for input(s): VITAMINB12, FOLATE, FERRITIN, TIBC, IRON, RETICCTPCT in the last 72 hours. Urine analysis:    Component Value Date/Time   COLORURINE yellow 12/18/2009 0818   APPEARANCEUR Clear 12/18/2009 0818   LABSPEC 1.020 12/18/2009 0818   PHURINE 5.0 12/18/2009 0818   HGBUR negative 12/18/2009 0818   BILIRUBINUR n 01/02/2012 0949   PROTEINUR n 01/02/2012 0949   UROBILINOGEN 0.2 01/02/2012 0949   UROBILINOGEN 0.2 12/18/2009 0818   NITRITE n 01/02/2012 0949   NITRITE negative 12/18/2009 0818   LEUKOCYTESUR Negative 01/02/2012 0949   Sepsis Labs: @LABRCNTIP (procalcitonin:4,lacticidven:4)  )No results found for this or any previous visit (from the past 240 hour(s)).    Radiology Studies: Ct Abdomen Pelvis W Contrast  Result Date: 12/30/2016 CLINICAL DATA:  Blood per rectum. EXAM: CT ABDOMEN AND PELVIS WITH CONTRAST TECHNIQUE: Multidetector CT imaging of the abdomen and pelvis was performed using the standard protocol following bolus administration of intravenous contrast. CONTRAST:  148mL ISOVUE-300 IOPAMIDOL (ISOVUE-300) INJECTION 61% COMPARISON:  None. FINDINGS: Lower chest: The lung bases are clear. No pleural effusion. The heart is upper limits of normal in size for age. No pericardial effusion. The distal esophagus is grossly normal. Hepatobiliary: Mild diffuse fatty infiltration of the liver. Simple right hepatic lobe cyst. No worrisome hepatic lesions or intrahepatic biliary dilatation. The gallbladder is grossly normal. No common bile duct dilatation.  Pancreas: No mass, inflammation or ductal dilatation. Spleen: Normal size.  No focal lesions. Adrenals/Urinary Tract: Calcification associated with the right adrenal gland likely due to prior hemorrhage/ trauma or infection. The left adrenal gland is normal. Both kidneys are unremarkable. There is a small cyst associated with the lower pole region of the left kidney. Stomach/Bowel: The stomach, duodenum, small bowel and terminal ileum are normal. The appendix is normal. Severe inflammatory or infectious colitis involving the left colon beginning at the splenic flexure and extending down to the sigmoid colon junction region. The sigmoid colon and rectum are unremarkable. The ascending and transverse colon are unremarkable. Vascular/Lymphatic: The aorta is normal in caliber. No dissection. The branch vessels are patent. The major venous structures are patent. No mesenteric or retroperitoneal mass or adenopathy. Small scattered lymph nodes are noted. Reproductive: The uterus and ovaries are unremarkable. Other: Small amount of free pelvic fluid. Musculoskeletal: No significant bony findings. 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 IMPRESSION: 1. Severe inflammatory or infectious colitis involving the splenic flexure and descending colon. Ischemic colitis would be another possibility given the distribution but all major vessels look normal and I think this is unlikely. 2. Mild diffuse fatty infiltration  of the liver and simple right hepatic lobe cyst. 3. Simple appearing left renal cysts. Electronically Signed   By: Marijo Sanes M.D.   On: 12/30/2016 14:57     Scheduled Meds: . iopamidol      . pantoprazole  40 mg Oral Q0600   Continuous Infusions:   LOS: 0 days   Time Spent in minutes   30 minutes  Abner Ardis D.O. on 12/30/2016 at 3:06 PM  Between 7am to 7pm - Pager - [phone]  After 7pm go to [email or web address] - password [email or web address]  And look for the night coverage person covering for me  after hours  [facility] Office  [phone]

## 2016-12-30 NOTE — ED Notes (Signed)
Pt ambulated to the bathroom and back to bed w/o any difficulty

## 2016-12-30 NOTE — Progress Notes (Signed)
CT findings reviewed with patient.  Despite the major mesenterics being patent, the hx and distribution are clinically c/w ischemic colitis. An infectious colitis cannot be excluded however antibiotics are not needed (no fever, no WBC elevation).  Pt says feels "so much better" than yesterday.  She wants to try a soft diet and wants to go home tonight if she tolerates diet. I advised her it would be prudent if she stays another night however if she feels well after eating tonight and wants to be discharged tonight its ok with GI. She can rest at home for a couple days and gradually advance her diet.  GI follow up as needed with Dr. Oretha Caprice.   Azucena Freed PA-C  [phone].

## 2016-12-30 NOTE — ED Notes (Signed)
Patient denies pain and is resting comfortably.  

## 2016-12-30 NOTE — ED Notes (Signed)
Patient and family given an updated, advised waiting on GI to see patient, to discuss further plan. Patient does not have any complaints at this time. Patient given water. Patient reports she has had 3 bright red stools since 0600.

## 2016-12-30 NOTE — Discharge Instructions (Signed)
Gastrointestinal Bleeding °Gastrointestinal bleeding is bleeding somewhere along the path food travels through the body (digestive tract). This path is anywhere between the mouth and the opening of the butt (anus). You may have blood in your poop (stools) or have black poop. If you throw up (vomit), there may be blood in it. °This condition can be mild, serious, or even life-threatening. If you have a lot of bleeding, you may need to stay in the hospital. °Follow these instructions at home: °· Take over-the-counter and prescription medicines only as told by your doctor. °· Eat foods that have a lot of fiber in them. These foods include whole grains, fruits, and vegetables. You can also try eating 1-3 prunes each day. °· Drink enough fluid to keep your pee (urine) clear or pale yellow. °· Keep all follow-up visits as told by your doctor. This is important. °Contact a doctor if: °· Your symptoms do not get better. °Get help right away if: °· Your bleeding gets worse. °· You feel dizzy or you pass out (faint). °· You feel weak. °· You have very bad cramps in your back or belly (abdomen). °· You pass large clumps of blood (clots) in your poop. °· Your symptoms are getting worse. °This information is not intended to replace advice given to you by your health care provider. Make sure you discuss any questions you have with your health care provider. °Document Released: 10/27/2007 Document Revised: 06/25/2015 Document Reviewed: 07/07/2014 °Elsevier Interactive Patient Education © 2018 [employer] Inc. ° °

## 2016-12-30 NOTE — Progress Notes (Signed)
Patient arrived to 6n17, alert and oriented, no reportable pain. IV intact and saline locked, VSS, oriented to room and staff, will continue to monitor.

## 2016-12-30 NOTE — Discharge Planning (Signed)
Patient discharged home in stable condition. Verbalizes understanding of all discharge instructions, including home medications and follow up appointments. 
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
## 2016-12-30 NOTE — Discharge Summary (Signed)
Physician Discharge Summary  Jacqueline Molina GEX:[id number] DOB: 1960-08-24 DOA: 12/29/2016  PCP: Marin Olp, MD  Admit date: 12/29/2016 Discharge date: 12/30/2016  Time spent: 45 minutes  Recommendations for Outpatient Follow-up:  Patient will be discharged to home.  Patient will need to follow up with primary care provider within one week of discharge, repeat CBC.  Follow up with Dr. Ardis Hughs, gastroenterologist, as needed.  Patient should continue medications as prescribed.  Patient should follow a soft diet and advance slowly.   Discharge Diagnoses:  Bright red blood per rectum/GI Bleeding GERD  Discharge Condition: Stable  Diet recommendation: soft  Filed Weights   12/29/16 1633  Weight: 76.2 kg (168 lb)    History of present illness:  On 12/29/2016 by Dr. Jennette Kettle Wynona Neat a 56 y.o.femalewith medical history significant ofGERD. Patient presents to the ED with c/o hematochezia and associated lower abdominal crampy pain. Had loose stool this AM, but bleeding onset at 2pm today. 5 episodes or so thus far since arrival to ED. Symptoms constant, not really improving. Quality is bright red, with clots. Not on blood thinners. No history of same. Colonoscopy 7 years ago, doesn't remember any mention of diverticulosis.  Hospital Course:  Bright red blood per rectum/GI Bleeding -suspect colitis vs diverticular bleed-patient states she had abdominal pain and diarrhea prior to bleeding episodes  -hemoglobin has remained stable, currently 13.1 -FOBT + -patient EGD in 2015 which was normal -Colonoscopy in 2012 normal -placed on protonix -Gastroenterology consulted and appreciated, and will obtain CT abdomen and possible supportive care for now -CT abd/pelvis with contrast: Severe inflammatory or infectious colitis involving the splenic flexure and descending colon. Ischemic colitis would be another possibility given the distribution but all major  vessels look normal. Mild diffuse fatty infiltration of liver, simple right hepatic lobe cyst. -given that patient is afebrile with no leukocytosis, will continue to monitor and not start antibiotics at this time- GI agreed -GI recommended patient stay for monitoring, however she is feeling better. If patient can eat and tolerate dinner, she can be discharged with outpatient follow as needed with Dr. Ardis Hughs.   GERD -Continue PPI  Procedures: None  Consultations: None  Discharge Exam: Vitals:   12/30/16 1100 12/30/16 1200  BP: 118/84 124/83  Pulse: 91 89  Resp: 17 18  Temp:    SpO2: 97% 97%     General: Well developed, well nourished, NAD, appears stated age  HEENT: NCAT, PERRLA, EOMI, Anicteic Sclera, mucous membranes moist.  Neck: Supple, no JVD, no masses  Cardiovascular: S1 S2 auscultated, no rubs, murmurs or gallops. Regular rate and rhythm.  Respiratory: Clear to auscultation bilaterally with equal chest rise  Abdomen: Soft, nontender, nondistended, + bowel sounds  Extremities: warm dry without cyanosis clubbing or edema  Neuro: AAOx3, cranial nerves grossly intact. Strength 5/5 in patient's upper and lower extremities bilaterally  Skin: Without rashes exudates or nodules  Psych: Normal affect and demeanor with intact judgement and insight  Discharge Instructions Discharge Instructions    Discharge instructions   Complete by:  As directed    Patient will be discharged to home.  Patient will need to follow up with primary care provider within one week of discharge, repeat CBC.  Follow up with Dr. Ardis Hughs, gastroenterologist, as needed.  Patient should continue medications as prescribed.  Patient should follow a soft diet and advance slowly.     Allergies as of 12/30/2016      Reactions   Other Nausea And  Vomiting   NARCOTICS; pt does NOT want to take any      Medication List    TAKE these medications   GLUCOSAMINE CHONDR COMPLEX PO Take 3 tablets by  mouth daily.   ibuprofen 200 MG tablet Commonly known as:  ADVIL,MOTRIN Take 600 mg by mouth daily. Usually takes at least 5 times a week   magic mouthwash Soln Take 5 mLs by mouth daily as needed for mouth pain.   multivitamin with minerals tablet Take 1 tablet by mouth daily.   ZEGERID OTC 20-1100 MG Caps capsule Generic drug:  Omeprazole-Sodium Bicarbonate Take 1 capsule by mouth every other day.      Allergies  Allergen Reactions  . Other Nausea And Vomiting    NARCOTICS; pt does NOT want to take any   Follow-up Information    Marin Olp, MD. Schedule an appointment as soon as possible for a visit in 1 week(s).   Specialty:  Family Medicine Why:  Hospital follow up Contact information: [place] [address] [phone]        Milus Banister, MD. Schedule an appointment as soon as possible for a visit.   Specialty:  Gastroenterology Why:  As needed Contact information: [address] [place] [address] [phone]            The results of significant diagnostics from this hospitalization (including imaging, microbiology, ancillary and laboratory) are listed below for reference.    Significant Diagnostic Studies: Ct Abdomen Pelvis W Contrast  Result Date: 12/30/2016 CLINICAL DATA:  Blood per rectum. EXAM: CT ABDOMEN AND PELVIS WITH CONTRAST TECHNIQUE: Multidetector CT imaging of the abdomen and pelvis was performed using the standard protocol following bolus administration of intravenous contrast. CONTRAST:  178mL ISOVUE-300 IOPAMIDOL (ISOVUE-300) INJECTION 61% COMPARISON:  None. FINDINGS: Lower chest: The lung bases are clear. No pleural effusion. The heart is upper limits of normal in size for age. No pericardial effusion. The distal esophagus is grossly normal. Hepatobiliary: Mild diffuse fatty infiltration of the liver. Simple right hepatic lobe cyst. No worrisome hepatic lesions or intrahepatic biliary dilatation.  The gallbladder is grossly normal. No common bile duct dilatation. Pancreas: No mass, inflammation or ductal dilatation. Spleen: Normal size.  No focal lesions. Adrenals/Urinary Tract: Calcification associated with the right adrenal gland likely due to prior hemorrhage/ trauma or infection. The left adrenal gland is normal. Both kidneys are unremarkable. There is a small cyst associated with the lower pole region of the left kidney. Stomach/Bowel: The stomach, duodenum, small bowel and terminal ileum are normal. The appendix is normal. Severe inflammatory or infectious colitis involving the left colon beginning at the splenic flexure and extending down to the sigmoid colon junction region. The sigmoid colon and rectum are unremarkable. The ascending and transverse colon are unremarkable. Vascular/Lymphatic: The aorta is normal in caliber. No dissection. The branch vessels are patent. The major venous structures are patent. No mesenteric or retroperitoneal mass or adenopathy. Small scattered lymph nodes are noted. Reproductive: The uterus and ovaries are unremarkable. Other: Small amount of free pelvic fluid. Musculoskeletal: No significant bony findings. 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 IMPRESSION: 1. Severe inflammatory or infectious colitis involving the splenic flexure and descending colon. Ischemic colitis would be another possibility given the distribution but all major vessels look normal and I think this is unlikely. 2. Mild diffuse fatty infiltration of the liver and simple right hepatic lobe cyst. 3. Simple appearing left renal cysts. Electronically Signed  By: Marijo Sanes M.D.   On: 12/30/2016 14:57    Microbiology: No results found for this or any previous visit (from the past 240 hour(s)).   Labs: Basic Metabolic Panel: Recent Labs  Lab 12/29/16 1636  NA 137  K 3.5  CL 105  CO2 25  GLUCOSE 145*  BUN 9  CREATININE 0.70  CALCIUM 10.4*   Liver Function Tests: Recent  Labs  Lab 12/29/16 1636  AST 27  ALT 25  ALKPHOS 85  BILITOT 0.8  PROT 7.1  ALBUMIN 4.3   No results for input(s): LIPASE, AMYLASE in the last 168 hours. No results for input(s): AMMONIA in the last 168 hours. CBC: Recent Labs  Lab 12/29/16 1636 12/30/16 0339  WBC 10.5 9.1  HGB 13.9 13.1  HCT 41.6 38.9  MCV 97.0 97.3  PLT 242 206   Cardiac Enzymes: No results for input(s): CKTOTAL, CKMB, CKMBINDEX, TROPONINI in the last 168 hours. BNP: BNP (last 3 results) No results for input(s): BNP in the last 8760 hours.  ProBNP (last 3 results) No results for input(s): PROBNP in the last 8760 hours.  CBG: No results for input(s): GLUCAP in the last 168 hours.     Signed:  Cristal Ford  [facility] 12/30/2016, 6:23 PM

## 2017-01-03 ENCOUNTER — Encounter: Payer: Self-pay | Admitting: Family Medicine

## 2017-01-03 ENCOUNTER — Ambulatory Visit (INDEPENDENT_AMBULATORY_CARE_PROVIDER_SITE_OTHER): Payer: BLUE CROSS/BLUE SHIELD | Admitting: Family Medicine

## 2017-01-03 VITALS — BP 134/86 | HR 80 | Temp 98.5°F | Ht 67.0 in | Wt 172.8 lb

## 2017-01-03 DIAGNOSIS — K529 Noninfective gastroenteritis and colitis, unspecified: Secondary | ICD-10-CM

## 2017-01-03 DIAGNOSIS — K219 Gastro-esophageal reflux disease without esophagitis: Secondary | ICD-10-CM

## 2017-01-03 DIAGNOSIS — K625 Hemorrhage of anus and rectum: Secondary | ICD-10-CM

## 2017-01-03 LAB — CBC
HCT: 40.5 % (ref 36.0–46.0)
Hemoglobin: 13.4 g/dL (ref 12.0–15.0)
MCHC: 33.1 g/dL (ref 30.0–36.0)
MCV: 99 fl (ref 78.0–100.0)
Platelets: 264 10*3/uL (ref 150.0–400.0)
RBC: 4.09 Mil/uL (ref 3.87–5.11)
RDW: 12.6 % (ref 11.5–15.5)
WBC: 4.4 10*3/uL (ref 4.0–10.5)

## 2017-01-03 LAB — BASIC METABOLIC PANEL
BUN: 6 mg/dL (ref 6–23)
CHLORIDE: 100 meq/L (ref 96–112)
CO2: 29 mEq/L (ref 19–32)
Calcium: 9.8 mg/dL (ref 8.4–10.5)
Creatinine, Ser: 0.69 mg/dL (ref 0.40–1.20)
GFR: 93.12 mL/min (ref >60.00)
Glucose, Bld: 109 mg/dL — ABNORMAL HIGH (ref 70–99)
POTASSIUM: 4.2 meq/L (ref 3.5–5.1)
SODIUM: 138 meq/L (ref 135–145)

## 2017-01-03 NOTE — Patient Instructions (Addendum)
Please stop by lab before you go  If bleeding worsens or persists another week or you have fever or worsening abdominal pain please see [place] back ASAP

## 2017-01-03 NOTE — Progress Notes (Signed)
Subjective:  Jacqueline Molina is a 56 y.o. year old very pleasant female patient who presents for/with See problem oriented charting ROS- mild fatigue. No dizziness, chest pain, shortness of breath. No edema.    Past Medical History-  Patient Active Problem List   Diagnosis Date Noted  . Skin cancer 04/22/2015    Priority: Medium  . GERD 06/11/2007    Priority: Medium  . Macrocytic anemia 12/30/2008    Priority: Low  . Elevated blood pressure 06/11/2007    Priority: Low  . GI bleed 12/30/2016  . BRBPR (bright red blood per rectum) 12/29/2016    Medications- reviewed and updated Current Outpatient Medications  Medication Sig Dispense Refill  . Glucosamine-Chondroitin (GLUCOSAMINE CHONDR COMPLEX PO) Take 3 tablets by mouth daily.    Marland Kitchen ibuprofen (ADVIL,MOTRIN) 200 MG tablet Take 600 mg by mouth daily. Usually takes at least 5 times a week    . magic mouthwash SOLN Take 5 mLs by mouth daily as needed for mouth pain.    . Multiple Vitamins-Minerals (MULTIVITAMIN WITH MINERALS) tablet Take 1 tablet by mouth daily.    [name] Bicarbonate (ZEGERID OTC) 20-1100 MG CAPS capsule Take 1 capsule by mouth every other day.      No current facility-administered medications for this visit.     Objective: BP 134/86 (BP Location: Left Arm, Patient Position: Sitting, Cuff Size: Large)   Pulse 80   Temp 98.5 F (36.9 C) (Oral)   Ht 5\' 7"  (1.702 m)   Wt 172 lb 12.8 oz (78.4 kg)   SpO2 95%   BMI 27.06 kg/m  Gen: NAD, resting comfortably No mucus membrane pallor CV: RRR no murmurs rubs or gallops Lungs: CTAB no crackles, wheeze, rhonchi Abdomen: soft/mild RLQ pain- slightly worse mild to moderate LLQ pain/nondistended/normal bowel sounds. No rebound or guarding.  Ext: no edema Skin: warm, dry  Assessment/Plan:  BRBPR (bright red blood per rectum) - Plan: CBC, Basic metabolic panel Colitis GERD S: Hospital discharge summary reviewed: patient observed overnight from 11/29 to  12/30/16 for bright red blood per rectum. Had bout of severe diarrhea 3x the night before. No vomiting. Also dealing with abdominimal painShe had 5 episodes of bleeding since arriving in ED so had rather large volume of stool- bright red with clots. No blood thinner or anticoagualnt. Colonoscopy 7 years prior. EGD 2015.   GI consulted. Patient with CT scan showing colitis in spelnic flexure and descending colon. I personally reviewed CT scan and note area of infalmmation in colon and splenic flexture as well as a small hepatic cyst as well as small renal cysts.   Supportive care provided. Patient was not started on antibiotics as was afebrile without leukocytosis.  Possibly ischemic colitis but not thought as likely given distribution and normal appearance of major vessels. She gradually improved without intervention- patient tells me she did not have fluids running and was also told to give bowel rest so she was worried about potential mild dehydration. She was discharged day after admission. She was continued on protonix then discharged back on home PPI.   Patient tells me that her last bloody bowel movement was very small and yesterday. She has stopped advil over last week and only using tylenol for improving abdominal pain. Complains of mild aching in RLQ. She is back on zegerid at home. Fortunately despite bloody bowel movements she never had a dip in her hemoglobin in hospital  A/P: suspect colitis- though whether ischemic or infectious is unclear. Advised patient  to stay off nsaids for now. Cbc again today did not show anemia. BMP also reassuring- no obvious dehydration Lab Results  Component Value Date   WBC 4.4 01/03/2017   HGB 13.4 01/03/2017   HCT 40.5 01/03/2017   MCV 99.0 01/03/2017   PLT 264.0 01/03/2017  Strict return precautions. Please see avs instructions. Continue home zegerid. Patient states as long as no recurrent bleeding she does not plan to follow up with GI- if recurrence  would certainly need to get her in with GI. Continue zegerid  Future Appointments  Date Time Provider [place]  01/18/2017  9:30 AM Princess Bruins, MD [facility]   Orders Placed This Encounter  Procedures  . CBC    [place]  . Basic metabolic panel    [place]   Garret Reddish, MD

## 2017-01-18 ENCOUNTER — Encounter: Payer: Self-pay | Admitting: Obstetrics & Gynecology

## 2017-02-24 ENCOUNTER — Encounter: Payer: Self-pay | Admitting: Obstetrics & Gynecology

## 2017-02-24 ENCOUNTER — Ambulatory Visit (INDEPENDENT_AMBULATORY_CARE_PROVIDER_SITE_OTHER): Payer: BLUE CROSS/BLUE SHIELD | Admitting: Obstetrics & Gynecology

## 2017-02-24 VITALS — BP 132/86 | Ht 66.25 in | Wt 172.0 lb

## 2017-02-24 DIAGNOSIS — Z01419 Encounter for gynecological examination (general) (routine) without abnormal findings: Secondary | ICD-10-CM | POA: Diagnosis not present

## 2017-02-24 DIAGNOSIS — Z13820 Encounter for screening for osteoporosis: Secondary | ICD-10-CM | POA: Diagnosis not present

## 2017-02-24 DIAGNOSIS — Z780 Asymptomatic menopausal state: Secondary | ICD-10-CM | POA: Diagnosis not present

## 2017-02-24 NOTE — Patient Instructions (Signed)
1. Well female exam with routine gynecological exam Normal gynecologic exam.  Pap test negative with negative high risk HPV December 2017.  Will repeat Pap test next year.  Breast exam normal.  Normal mammogram May 2018.  Health labs with family physician.  Colonoscopy 2012.  2. Menopause present Well on no hormone replacement therapy.  No postmenopausal bleeding.  3. Screening for osteoporosis Continue with vitamin D supplements, calcium rich nutrition and regular weightbearing physical activity.  Will follow up here for bone density. - DG Bone Density; Future  Jacqueline Molina, it was a pleasure seeing you today!   Health Maintenance for Postmenopausal Women Menopause is a normal process in which your reproductive ability comes to an end. This process happens gradually over a span of months to years, usually between the ages of 41 and 40. Menopause is complete when you have missed 12 consecutive menstrual periods. It is important to talk with your health care provider about some of the most common conditions that affect postmenopausal women, such as heart disease, cancer, and bone loss (osteoporosis). Adopting a healthy lifestyle and getting preventive care can help to promote your health and wellness. Those actions can also lower your chances of developing some of these common conditions. What should I know about menopause? During menopause, you may experience a number of symptoms, such as:  Moderate-to-severe hot flashes.  Night sweats.  Decrease in sex drive.  Mood swings.  Headaches.  Tiredness.  Irritability.  Memory problems.  Insomnia.  Choosing to treat or not to treat menopausal changes is an individual decision that you make with your health care provider. What should I know about hormone replacement therapy and supplements? Hormone therapy products are effective for treating symptoms that are associated with menopause, such as hot flashes and night sweats. Hormone replacement  carries certain risks, especially as you become older. If you are thinking about using estrogen or estrogen with progestin treatments, discuss the benefits and risks with your health care provider. What should I know about heart disease and stroke? Heart disease, heart attack, and stroke become more likely as you age. This may be due, in part, to the hormonal changes that your body experiences during menopause. These can affect how your body processes dietary fats, triglycerides, and cholesterol. Heart attack and stroke are both medical emergencies. There are many things that you can do to help prevent heart disease and stroke:  Have your blood pressure checked at least every 1-2 years. High blood pressure causes heart disease and increases the risk of stroke.  If you are 43-34 years old, ask your health care provider if you should take aspirin to prevent a heart attack or a stroke.  Do not use any tobacco products, including cigarettes, chewing tobacco, or electronic cigarettes. If you need help quitting, ask your health care provider.  It is important to eat a healthy diet and maintain a healthy weight. ? Be sure to include plenty of vegetables, fruits, low-fat dairy products, and lean protein. ? Avoid eating foods that are high in solid fats, added sugars, or salt (sodium).  Get regular exercise. This is one of the most important things that you can do for your health. ? Try to exercise for at least 150 minutes each week. The type of exercise that you do should increase your heart rate and make you sweat. This is known as moderate-intensity exercise. ? Try to do strengthening exercises at least twice each week. Do these in addition to the moderate-intensity exercise.  Know your numbers.Ask your health care provider to check your cholesterol and your blood glucose. Continue to have your blood tested as directed by your health care provider.  What should I know about cancer screening? There  are several types of cancer. Take the following steps to reduce your risk and to catch any cancer development as early as possible. Breast Cancer  Practice breast self-awareness. ? This means understanding how your breasts normally appear and feel. ? It also means doing regular breast self-exams. Let your health care provider know about any changes, no matter how small.  If you are 51 or older, have a clinician do a breast exam (clinical breast exam or CBE) every year. Depending on your age, family history, and medical history, it may be recommended that you also have a yearly breast X-ray (mammogram).  If you have a family history of breast cancer, talk with your health care provider about genetic screening.  If you are at high risk for breast cancer, talk with your health care provider about having an MRI and a mammogram every year.  Breast cancer (BRCA) gene test is recommended for women who have family members with BRCA-related cancers. Results of the assessment will determine the need for genetic counseling and BRCA1 and for BRCA2 testing. BRCA-related cancers include these types: ? Breast. This occurs in males or females. ? Ovarian. ? Tubal. This may also be called fallopian tube cancer. ? Cancer of the abdominal or pelvic lining (peritoneal cancer). ? Prostate. ? Pancreatic.  Cervical, Uterine, and Ovarian Cancer Your health care provider may recommend that you be screened regularly for cancer of the pelvic organs. These include your ovaries, uterus, and vagina. This screening involves a pelvic exam, which includes checking for microscopic changes to the surface of your cervix (Pap test).  For women ages 21-65, health care providers may recommend a pelvic exam and a Pap test every three years. For women ages 14-65, they may recommend the Pap test and pelvic exam, combined with testing for human papilloma virus (HPV), every five years. Some types of HPV increase your risk of cervical  cancer. Testing for HPV may also be done on women of any age who have unclear Pap test results.  Other health care providers may not recommend any screening for nonpregnant women who are considered low risk for pelvic cancer and have no symptoms. Ask your health care provider if a screening pelvic exam is right for you.  If you have had past treatment for cervical cancer or a condition that could lead to cancer, you need Pap tests and screening for cancer for at least 20 years after your treatment. If Pap tests have been discontinued for you, your risk factors (such as having a new sexual partner) need to be reassessed to determine if you should start having screenings again. Some women have medical problems that increase the chance of getting cervical cancer. In these cases, your health care provider may recommend that you have screening and Pap tests more often.  If you have a family history of uterine cancer or ovarian cancer, talk with your health care provider about genetic screening.  If you have vaginal bleeding after reaching menopause, tell your health care provider.  There are currently no reliable tests available to screen for ovarian cancer.  Lung Cancer Lung cancer screening is recommended for adults 27-61 years old who are at high risk for lung cancer because of a history of smoking. A yearly low-dose CT scan of the  lungs is recommended if you:  Currently smoke.  Have a history of at least 30 pack-years of smoking and you currently smoke or have quit within the past 15 years. A pack-year is smoking an average of one pack of cigarettes per day for one year.  Yearly screening should:  Continue until it has been 15 years since you quit.  Stop if you develop a health problem that would prevent you from having lung cancer treatment.  Colorectal Cancer  This type of cancer can be detected and can often be prevented.  Routine colorectal cancer screening usually begins at age 53  and continues through age 66.  If you have risk factors for colon cancer, your health care provider may recommend that you be screened at an earlier age.  If you have a family history of colorectal cancer, talk with your health care provider about genetic screening.  Your health care provider may also recommend using home test kits to check for hidden blood in your stool.  A small camera at the end of a tube can be used to examine your colon directly (sigmoidoscopy or colonoscopy). This is done to check for the earliest forms of colorectal cancer.  Direct examination of the colon should be repeated every 5-10 years until age 25. However, if early forms of precancerous polyps or small growths are found or if you have a family history or genetic risk for colorectal cancer, you may need to be screened more often.  Skin Cancer  Check your skin from head to toe regularly.  Monitor any moles. Be sure to tell your health care provider: ? About any new moles or changes in moles, especially if there is a change in a mole's shape or color. ? If you have a mole that is larger than the size of a pencil eraser.  If any of your family members has a history of skin cancer, especially at a young age, talk with your health care provider about genetic screening.  Always use sunscreen. Apply sunscreen liberally and repeatedly throughout the day.  Whenever you are outside, protect yourself by wearing long sleeves, pants, a wide-brimmed hat, and sunglasses.  What should I know about osteoporosis? Osteoporosis is a condition in which bone destruction happens more quickly than new bone creation. After menopause, you may be at an increased risk for osteoporosis. To help prevent osteoporosis or the bone fractures that can happen because of osteoporosis, the following is recommended:  If you are 65-107 years old, get at least 1,000 mg of calcium and at least 600 mg of vitamin D per day.  If you are older than  age 32 but younger than age 92, get at least 1,200 mg of calcium and at least 600 mg of vitamin D per day.  If you are older than age 65, get at least 1,200 mg of calcium and at least 800 mg of vitamin D per day.  Smoking and excessive alcohol intake increase the risk of osteoporosis. Eat foods that are rich in calcium and vitamin D, and do weight-bearing exercises several times each week as directed by your health care provider. What should I know about how menopause affects my mental health? Depression may occur at any age, but it is more common as you become older. Common symptoms of depression include:  Low or sad mood.  Changes in sleep patterns.  Changes in appetite or eating patterns.  Feeling an overall lack of motivation or enjoyment of activities that you previously  enjoyed.  Frequent crying spells.  Talk with your health care provider if you think that you are experiencing depression. What should I know about immunizations? It is important that you get and maintain your immunizations. These include:  Tetanus, diphtheria, and pertussis (Tdap) booster vaccine.  Influenza every year before the flu season begins.  Pneumonia vaccine.  Shingles vaccine.  Your health care provider may also recommend other immunizations. This information is not intended to replace advice given to you by your health care provider. Make sure you discuss any questions you have with your health care provider. Document Released: 03/11/2005 Document Revised: 08/07/2015 Document Reviewed: 10/21/2014 Elsevier Interactive Patient Education  2018 [employer].

## 2017-02-24 NOTE — Progress Notes (Signed)
Jacqueline Molina [birth year] [id number]   History:    57 y.o. G1P1L1 Divorced.  Real estate agent.  RP:  Established patient presenting for annual gyn exam   HPI: Menopausal times about 5 years.  No hormone replacement therapy.  Well-tolerated.  No postmenopausal bleeding.  Abstinent since last annual exam with Pap test.  Urine and bowel movements normal.  Had an episode of colitis which resolved with supportive management in December 2018.  Breasts normal.  Exercises regularly, going to the gym for aerobic and weightlifting.  Body mass index 27.55.  Health labs with family physician.  Past medical history,surgical history, family history and social history were all reviewed and documented in the EPIC chart.  Gynecologic History No LMP recorded. Patient is postmenopausal. Contraception: post menopausal status Last Pap: 01/2016. Results were: negative/HPV HR neg Last mammogram: 05/2016. Results were: negative Bone density: Never Colonoscopy 2012  Obstetric History G1P1L1  ROS: A ROS was performed and pertinent positives and negatives are included in the history.  GENERAL: No fevers or chills. HEENT: No change in vision, no earache, sore throat or sinus congestion. NECK: No pain or stiffness. CARDIOVASCULAR: No chest pain or pressure. No palpitations. PULMONARY: No shortness of breath, cough or wheeze. GASTROINTESTINAL: No abdominal pain, nausea, vomiting or diarrhea, melena or bright red blood per rectum. GENITOURINARY: No urinary frequency, urgency, hesitancy or dysuria. MUSCULOSKELETAL: No joint or muscle pain, no back pain, no recent trauma. DERMATOLOGIC: No rash, no itching, no lesions. ENDOCRINE: No polyuria, polydipsia, no heat or cold intolerance. No recent change in weight. HEMATOLOGICAL: No anemia or easy bruising or bleeding. NEUROLOGIC: No headache, seizures, numbness, tingling or weakness. PSYCHIATRIC: No depression, no loss of interest in normal activity or change in sleep  pattern.     Exam:  BP 132/86  Ht 5' 6.25" (1.683 m)   Wt 172 lb (78 kg)   BMI 27.55 kg/m   Body mass index is 27.55 kg/m.  General appearance : Well developed well nourished female. No acute distress HEENT: Eyes: no retinal hemorrhage or exudates,  Neck supple, trachea midline, no carotid bruits, no thyroidmegaly Lungs: Clear to auscultation, no rhonchi or wheezes, or rib retractions  Heart: Regular rate and rhythm, no murmurs or gallops Breast:Examined in sitting and supine position were symmetrical in appearance, no palpable masses or tenderness,  no skin retraction, no nipple inversion, no nipple discharge, no skin discoloration, no axillary or supraclavicular lymphadenopathy Abdomen: no palpable masses or tenderness, no rebound or guarding Extremities: no edema or skin discoloration or tenderness  Pelvic: Vulva normal, with a small flat hemangioma about 5 mm anterior mid vulva.  Bartholin, Urethra, Skene Glands: Within normal limits             Vagina: No gross lesions or discharge  Cervix: No gross lesions or discharge  Uterus  AV, normal size, shape and consistency, non-tender and mobile  Adnexa  Without masses or tenderness  Anus and perineum  normal    Assessment/Plan:  57 y.o. female for annual exam   1. Well female exam with routine gynecological exam Normal gynecologic exam.  Pap test negative with negative high risk HPV December 2017.  Will repeat Pap test next year.  Breast exam normal.  Normal mammogram May 2018.  Health labs with family physician.  Colonoscopy 2012.  2. Menopause present Well on no hormone replacement therapy.  No postmenopausal bleeding.  3. Screening for osteoporosis Continue with vitamin D supplements, calcium rich nutrition and regular weightbearing  physical activity.  Will follow up here for bone density. - DG Bone Density; Future  Princess Bruins MD, 9:20 AM 02/24/2017

## 2017-02-28 ENCOUNTER — Other Ambulatory Visit: Payer: Self-pay | Admitting: Gynecology

## 2017-02-28 DIAGNOSIS — Z13820 Encounter for screening for osteoporosis: Secondary | ICD-10-CM

## 2017-03-14 ENCOUNTER — Ambulatory Visit (INDEPENDENT_AMBULATORY_CARE_PROVIDER_SITE_OTHER): Payer: BLUE CROSS/BLUE SHIELD

## 2017-03-14 DIAGNOSIS — Z13820 Encounter for screening for osteoporosis: Secondary | ICD-10-CM | POA: Diagnosis not present

## 2017-03-15 ENCOUNTER — Encounter: Payer: Self-pay | Admitting: Gynecology

## 2017-04-17 ENCOUNTER — Ambulatory Visit (INDEPENDENT_AMBULATORY_CARE_PROVIDER_SITE_OTHER): Payer: BLUE CROSS/BLUE SHIELD | Admitting: Family Medicine

## 2017-04-17 ENCOUNTER — Encounter: Payer: Self-pay | Admitting: Family Medicine

## 2017-04-17 VITALS — BP 126/86 | HR 82 | Temp 98.2°F | Ht 67.0 in | Wt 178.4 lb

## 2017-04-17 DIAGNOSIS — R05 Cough: Secondary | ICD-10-CM

## 2017-04-17 DIAGNOSIS — R059 Cough, unspecified: Secondary | ICD-10-CM

## 2017-04-17 MED ORDER — AZITHROMYCIN 250 MG PO TABS: ORAL_TABLET | ORAL | 0 refills | Status: DC

## 2017-04-17 MED ORDER — PREDNISONE 5 MG PO TABS: ORAL_TABLET | ORAL | 0 refills | Status: DC

## 2017-04-17 NOTE — Progress Notes (Signed)
   Jacqueline Molina is a 57 y.o. female here for an acute visit.  History of Present Illness:   Shaune Pascal CMA acting as scribe for Dr. Juleen China.  Cough  This is a chronic problem. The current episode started 1 to 4 weeks ago. The problem has been waxing and waning. The cough is non-productive. Associated symptoms include nasal congestion, postnasal drip and wheezing. Pertinent negatives include no heartburn or shortness of breath. She has tried nothing for the symptoms.    PMHx, SurgHx, SocialHx, Medications, and Allergies were reviewed in the Visit Navigator and updated as appropriate.  Current Medications:   Marland Kitchen  Glucosamine-Chondroitin (GLUCOSAMINE CHONDR COMPLEX PO), Take 3 tablets by mouth daily., Disp: , Rfl:  .  ibuprofen (ADVIL,MOTRIN) 200 MG tablet, Take 600 mg by mouth daily. Usually takes at least 5 times a week, Disp: , Rfl:  .  Multiple Vitamins-Minerals (MULTIVITAMIN WITH MINERALS) tablet, Take 1 tablet by mouth daily., Disp: , Rfl:  .  Omeprazole-Sodium Bicarbonate (ZEGERID OTC) 20-1100 MG CAPS capsule, Take 1 capsule by mouth every other day. , Disp: , Rfl:   Allergies  Allergen Reactions  . Other Nausea And Vomiting    NARCOTICS; pt does NOT want to take any   Review of Systems:   Pertinent items are noted in the HPI. Otherwise, ROS is negative.  Vitals:   Vitals:   04/17/17 1331  BP: 126/86  Pulse: 82  Temp: 98.2 F (36.8 C)  TempSrc: Oral  SpO2: 99%  Weight: 178 lb 6.4 oz (80.9 kg)  Height: 5\' 7"  (1.702 m)     Body mass index is 27.94 kg/m.  Physical Exam:   Physical Exam  Constitutional: She appears well-developed and well-nourished. No distress.  HENT:  Head: Normocephalic and atraumatic.  Right Ear: External ear normal.  Left Ear: External ear normal.  Nose: Nose normal.  Mouth/Throat: Oropharynx is clear and moist.  Eyes: Conjunctivae and EOM are normal. Pupils are equal, round, and reactive to light.  Neck: Normal range of motion. Neck  supple.  Cardiovascular: Normal rate, regular rhythm and intact distal pulses.  Pulmonary/Chest: Effort normal. She has wheezes. She has rhonchi.  Abdominal: Soft.  Skin: Skin is warm.  Psychiatric: She has a normal mood and affect. Her behavior is normal.  Nursing note and vitals reviewed.   Assessment and Plan:   1. Cough - predniSONE (DELTASONE) 5 MG tablet; 6,5,4,3,2,1  Dispense: 21 tablet; Refill: 0 - azithromycin (ZITHROMAX) 250 MG tablet; Take two the first day and one tablet daily until done  Dispense: 6 tablet; Refill: 0  . Reviewed expectations re: course of current medical issues. . Discussed self-management of symptoms. . Outlined signs and symptoms indicating need for more acute intervention. . Patient verbalized understanding and all questions were answered. Marland Kitchen Health Maintenance issues including appropriate healthy diet, exercise, and smoking avoidance were discussed with patient. . See orders for this visit as documented in the electronic medical record. . Patient received an After Visit Summary.  CMA served as Education administrator during this visit. History, Physical, and Plan performed by medical provider. The above documentation has been reviewed and is accurate and complete. Briscoe Deutscher, D.O.  Briscoe Deutscher, DO [place] [place] [facility] 04/17/2017

## 2017-06-09 DIAGNOSIS — D2262 Melanocytic nevi of left upper limb, including shoulder: Secondary | ICD-10-CM | POA: Diagnosis not present

## 2017-06-09 DIAGNOSIS — Z85828 Personal history of other malignant neoplasm of skin: Secondary | ICD-10-CM | POA: Diagnosis not present

## 2017-06-09 DIAGNOSIS — L812 Freckles: Secondary | ICD-10-CM | POA: Diagnosis not present

## 2017-06-09 DIAGNOSIS — L570 Actinic keratosis: Secondary | ICD-10-CM | POA: Diagnosis not present

## 2017-06-09 DIAGNOSIS — D2261 Melanocytic nevi of right upper limb, including shoulder: Secondary | ICD-10-CM | POA: Diagnosis not present

## 2017-07-18 ENCOUNTER — Other Ambulatory Visit: Payer: Self-pay | Admitting: Obstetrics & Gynecology

## 2017-07-18 DIAGNOSIS — Z1231 Encounter for screening mammogram for malignant neoplasm of breast: Secondary | ICD-10-CM

## 2017-07-20 DIAGNOSIS — H5201 Hypermetropia, right eye: Secondary | ICD-10-CM | POA: Diagnosis not present

## 2017-07-20 DIAGNOSIS — H5212 Myopia, left eye: Secondary | ICD-10-CM | POA: Diagnosis not present

## 2017-07-20 DIAGNOSIS — H04123 Dry eye syndrome of bilateral lacrimal glands: Secondary | ICD-10-CM | POA: Diagnosis not present

## 2017-08-08 ENCOUNTER — Ambulatory Visit
Admission: RE | Admit: 2017-08-08 | Discharge: 2017-08-08 | Disposition: A | Discharge status: Home / Self Care | Payer: BLUE CROSS/BLUE SHIELD | Source: Ambulatory Visit | Attending: Obstetrics & Gynecology | Admitting: Obstetrics & Gynecology

## 2017-08-08 DIAGNOSIS — Z1231 Encounter for screening mammogram for malignant neoplasm of breast: Secondary | ICD-10-CM

## 2017-11-07 DIAGNOSIS — Z23 Encounter for immunization: Secondary | ICD-10-CM | POA: Diagnosis not present

## 2017-12-07 DIAGNOSIS — M9903 Segmental and somatic dysfunction of lumbar region: Secondary | ICD-10-CM | POA: Diagnosis not present

## 2017-12-07 DIAGNOSIS — M9905 Segmental and somatic dysfunction of pelvic region: Secondary | ICD-10-CM | POA: Diagnosis not present

## 2017-12-07 DIAGNOSIS — M9904 Segmental and somatic dysfunction of sacral region: Secondary | ICD-10-CM | POA: Diagnosis not present

## 2017-12-07 DIAGNOSIS — M5136 Other intervertebral disc degeneration, lumbar region: Secondary | ICD-10-CM | POA: Diagnosis not present

## 2017-12-12 DIAGNOSIS — M9905 Segmental and somatic dysfunction of pelvic region: Secondary | ICD-10-CM | POA: Diagnosis not present

## 2017-12-12 DIAGNOSIS — M5136 Other intervertebral disc degeneration, lumbar region: Secondary | ICD-10-CM | POA: Diagnosis not present

## 2017-12-12 DIAGNOSIS — M9903 Segmental and somatic dysfunction of lumbar region: Secondary | ICD-10-CM | POA: Diagnosis not present

## 2017-12-12 DIAGNOSIS — M9904 Segmental and somatic dysfunction of sacral region: Secondary | ICD-10-CM | POA: Diagnosis not present

## 2017-12-14 DIAGNOSIS — M9903 Segmental and somatic dysfunction of lumbar region: Secondary | ICD-10-CM | POA: Diagnosis not present

## 2017-12-14 DIAGNOSIS — M5136 Other intervertebral disc degeneration, lumbar region: Secondary | ICD-10-CM | POA: Diagnosis not present

## 2017-12-14 DIAGNOSIS — M9904 Segmental and somatic dysfunction of sacral region: Secondary | ICD-10-CM | POA: Diagnosis not present

## 2017-12-14 DIAGNOSIS — M9905 Segmental and somatic dysfunction of pelvic region: Secondary | ICD-10-CM | POA: Diagnosis not present

## 2017-12-25 DIAGNOSIS — M5136 Other intervertebral disc degeneration, lumbar region: Secondary | ICD-10-CM | POA: Diagnosis not present

## 2017-12-25 DIAGNOSIS — M9905 Segmental and somatic dysfunction of pelvic region: Secondary | ICD-10-CM | POA: Diagnosis not present

## 2017-12-25 DIAGNOSIS — M9903 Segmental and somatic dysfunction of lumbar region: Secondary | ICD-10-CM | POA: Diagnosis not present

## 2017-12-25 DIAGNOSIS — M9904 Segmental and somatic dysfunction of sacral region: Secondary | ICD-10-CM | POA: Diagnosis not present

## 2018-01-15 DIAGNOSIS — M9904 Segmental and somatic dysfunction of sacral region: Secondary | ICD-10-CM | POA: Diagnosis not present

## 2018-01-15 DIAGNOSIS — M9905 Segmental and somatic dysfunction of pelvic region: Secondary | ICD-10-CM | POA: Diagnosis not present

## 2018-01-15 DIAGNOSIS — M5136 Other intervertebral disc degeneration, lumbar region: Secondary | ICD-10-CM | POA: Diagnosis not present

## 2018-01-15 DIAGNOSIS — M9903 Segmental and somatic dysfunction of lumbar region: Secondary | ICD-10-CM | POA: Diagnosis not present

## 2018-02-20 ENCOUNTER — Ambulatory Visit: Payer: BLUE CROSS/BLUE SHIELD | Admitting: Family Medicine

## 2018-02-21 ENCOUNTER — Encounter: Payer: Self-pay | Admitting: Family Medicine

## 2018-03-01 ENCOUNTER — Encounter: Payer: BLUE CROSS/BLUE SHIELD | Admitting: Obstetrics & Gynecology

## 2018-03-13 ENCOUNTER — Ambulatory Visit (INDEPENDENT_AMBULATORY_CARE_PROVIDER_SITE_OTHER): Payer: BLUE CROSS/BLUE SHIELD | Admitting: Obstetrics & Gynecology

## 2018-03-13 ENCOUNTER — Encounter: Payer: Self-pay | Admitting: Obstetrics & Gynecology

## 2018-03-13 VITALS — BP 142/90 | Ht 66.5 in | Wt 177.0 lb

## 2018-03-13 DIAGNOSIS — Z780 Asymptomatic menopausal state: Secondary | ICD-10-CM

## 2018-03-13 DIAGNOSIS — Z1151 Encounter for screening for human papillomavirus (HPV): Secondary | ICD-10-CM | POA: Diagnosis not present

## 2018-03-13 DIAGNOSIS — Z01419 Encounter for gynecological examination (general) (routine) without abnormal findings: Secondary | ICD-10-CM | POA: Diagnosis not present

## 2018-03-13 DIAGNOSIS — E663 Overweight: Secondary | ICD-10-CM | POA: Diagnosis not present

## 2018-03-13 NOTE — Addendum Note (Signed)
Addended by: Thurnell Garbe A on: 03/13/2018 02:27 PM   Modules accepted: Orders

## 2018-03-13 NOTE — Addendum Note (Signed)
Addended by: Thurnell Garbe A on: 03/13/2018 02:29 PM   Modules accepted: Orders

## 2018-03-13 NOTE — Patient Instructions (Signed)
1. Encounter for routine gynecological examination with Papanicolaou smear of cervix Normal gynecologic exam in menopause.  Pap with high-risk HPV done today.  Breast exam normal.  Last screening mammogram July 2019 was negative.  Health labs with family physician.  Will repeat screening colonoscopy in 2022.  2. Postmenopausal Well on no hormone replacement therapy.  No postmenopausal bleeding.  Bone density February 2019 was normal.  We will repeat it at 5 years.  Vitamin D supplements, calcium intake of 1.5 g/day and regular weightbearing physical activities to continue.  3. Overweight (BMI 25.0-29.9) Recommend a lower calorie/carb diet such as [employer].  Intermittent fasting discussed with patient.  Aerobic physical activities 5 times a week and weightlifting every 2 days.  Jacqueline Molina, it was a pleasure seeing you today!  I will inform you of your results as soon as they are available.

## 2018-03-13 NOTE — Progress Notes (Signed)
Jacqueline Molina 02-05-1960 [id number]   History:    58 y.o. G1P1L1  Divorced.  Real Estate agent x 2 yrs.  RP:  Established patient presenting for annual gyn exam   HPI: Menopause, well on no HRT.  No PMB.  No pelvic pain.  Abstinent x last year.  Breasts normal.  Urine/BMs normal.  BMI 28.14.  Health labs with Fam MD.    Past medical history,surgical history, family history and social history were all reviewed and documented in the EPIC chart.  Gynecologic History No LMP recorded. Patient is postmenopausal. Contraception: abstinence and post menopausal status Last Pap: 2017. Results were: Negative/HPV HR neg Last mammogram: 07/2017. Results were: Negative Bone Density: 03/2017 Normal.  Repeat in 5 yrs. Colonoscopy: 2012, 10 yr schedule  Obstetric History OB History  Gravida Para Term Preterm AB Living  1 1       1   SAB TAB Ectopic Multiple Live Births               # Outcome Date GA Lbr Len/2nd Weight Sex Delivery Anes PTL Lv  1 Para              ROS: A ROS was performed and pertinent positives and negatives are included in the history.  GENERAL: No fevers or chills. HEENT: No change in vision, no earache, sore throat or sinus congestion. NECK: No pain or stiffness. CARDIOVASCULAR: No chest pain or pressure. No palpitations. PULMONARY: No shortness of breath, cough or wheeze. GASTROINTESTINAL: No abdominal pain, nausea, vomiting or diarrhea, melena or bright red blood per rectum. GENITOURINARY: No urinary frequency, urgency, hesitancy or dysuria. MUSCULOSKELETAL: No joint or muscle pain, no back pain, no recent trauma. DERMATOLOGIC: No rash, no itching, no lesions. ENDOCRINE: No polyuria, polydipsia, no heat or cold intolerance. No recent change in weight. HEMATOLOGICAL: No anemia or easy bruising or bleeding. NEUROLOGIC: No headache, seizures, numbness, tingling or weakness. PSYCHIATRIC: No depression, no loss of interest in normal activity or change in sleep pattern.      Exam:   BP (!) 142/90   Ht 5' 6.5" (1.689 m)   Wt 177 lb (80.3 kg)   BMI 28.14 kg/m   Body mass index is 28.14 kg/m.  General appearance : Well developed well nourished female. No acute distress HEENT: Eyes: no retinal hemorrhage or exudates,  Neck supple, trachea midline, no carotid bruits, no thyroidmegaly Lungs: Clear to auscultation, no rhonchi or wheezes, or rib retractions  Heart: Regular rate and rhythm, no murmurs or gallops Breast:Examined in sitting and supine position were symmetrical in appearance, no palpable masses or tenderness,  no skin retraction, no nipple inversion, no nipple discharge, no skin discoloration, no axillary or supraclavicular lymphadenopathy Abdomen: no palpable masses or tenderness, no rebound or guarding Extremities: no edema or skin discoloration or tenderness  Pelvic: Vulva: Normal             Vagina: No gross lesions or discharge  Cervix: No gross lesions or discharge.  Pap/HPV HR done.  Uterus  AV, normal size, shape and consistency, non-tender and mobile  Adnexa  Without masses or tenderness  Anus: Normal   Assessment/Plan:  58 y.o. female for annual exam   1. Encounter for routine gynecological examination with Papanicolaou smear of cervix Normal gynecologic exam in menopause.  Pap with high-risk HPV done today.  Breast exam normal.  Last screening mammogram July 2019 was negative.  Health labs with family physician.  Will repeat screening colonoscopy in 2022.  2. Postmenopausal Well on no hormone replacement therapy.  No postmenopausal bleeding.  Bone density February 2019 was normal.  We will repeat it at 5 years.  Vitamin D supplements, calcium intake of 1.5 g/day and regular weightbearing physical activities to continue.  3. Overweight (BMI 25.0-29.9) Recommend a lower calorie/carb diet such as [employer].  Intermittent fasting discussed with patient.  Aerobic physical activities 5 times a week and weightlifting every 2  days.  Princess Bruins MD, 12:42 PM 03/13/2018

## 2018-03-15 LAB — PAP, TP IMAGING W/ HPV RNA, RFLX HPV TYPE 16,18/45: HPV DNA High Risk: NOT DETECTED

## 2018-03-16 ENCOUNTER — Encounter: Payer: Self-pay | Admitting: *Deleted

## 2018-03-22 DIAGNOSIS — S52124A Nondisplaced fracture of head of right radius, initial encounter for closed fracture: Secondary | ICD-10-CM | POA: Diagnosis not present

## 2018-03-27 ENCOUNTER — Other Ambulatory Visit: Payer: Self-pay | Admitting: Orthopaedic Surgery

## 2018-03-27 ENCOUNTER — Ambulatory Visit
Admission: RE | Admit: 2018-03-27 | Discharge: 2018-03-27 | Disposition: A | Discharge status: Home / Self Care | Payer: BLUE CROSS/BLUE SHIELD | Source: Ambulatory Visit | Attending: Orthopaedic Surgery | Admitting: Orthopaedic Surgery

## 2018-03-27 ENCOUNTER — Other Ambulatory Visit: Payer: Self-pay | Admitting: Orthopedic Surgery

## 2018-03-27 DIAGNOSIS — S52124D Nondisplaced fracture of head of right radius, subsequent encounter for closed fracture with routine healing: Secondary | ICD-10-CM | POA: Diagnosis not present

## 2018-03-27 DIAGNOSIS — S52124A Nondisplaced fracture of head of right radius, initial encounter for closed fracture: Secondary | ICD-10-CM

## 2018-03-27 DIAGNOSIS — S52121A Displaced fracture of head of right radius, initial encounter for closed fracture: Secondary | ICD-10-CM | POA: Diagnosis not present

## 2018-04-03 DIAGNOSIS — S52124D Nondisplaced fracture of head of right radius, subsequent encounter for closed fracture with routine healing: Secondary | ICD-10-CM | POA: Diagnosis not present

## 2018-05-01 DIAGNOSIS — S52124D Nondisplaced fracture of head of right radius, subsequent encounter for closed fracture with routine healing: Secondary | ICD-10-CM | POA: Diagnosis not present

## 2018-05-31 DIAGNOSIS — S52124D Nondisplaced fracture of head of right radius, subsequent encounter for closed fracture with routine healing: Secondary | ICD-10-CM | POA: Diagnosis not present

## 2018-06-12 DIAGNOSIS — M6281 Muscle weakness (generalized): Secondary | ICD-10-CM | POA: Diagnosis not present

## 2018-06-12 DIAGNOSIS — S52124S Nondisplaced fracture of head of right radius, sequela: Secondary | ICD-10-CM | POA: Diagnosis not present

## 2018-06-12 DIAGNOSIS — M79642 Pain in left hand: Secondary | ICD-10-CM | POA: Diagnosis not present

## 2018-06-12 DIAGNOSIS — M25621 Stiffness of right elbow, not elsewhere classified: Secondary | ICD-10-CM | POA: Diagnosis not present

## 2018-06-12 DIAGNOSIS — M25521 Pain in right elbow: Secondary | ICD-10-CM | POA: Diagnosis not present

## 2018-06-14 DIAGNOSIS — M6281 Muscle weakness (generalized): Secondary | ICD-10-CM | POA: Diagnosis not present

## 2018-06-14 DIAGNOSIS — S52124S Nondisplaced fracture of head of right radius, sequela: Secondary | ICD-10-CM | POA: Diagnosis not present

## 2018-06-14 DIAGNOSIS — M25621 Stiffness of right elbow, not elsewhere classified: Secondary | ICD-10-CM | POA: Diagnosis not present

## 2018-06-14 DIAGNOSIS — M25521 Pain in right elbow: Secondary | ICD-10-CM | POA: Diagnosis not present

## 2018-06-18 DIAGNOSIS — D2261 Melanocytic nevi of right upper limb, including shoulder: Secondary | ICD-10-CM | POA: Diagnosis not present

## 2018-06-18 DIAGNOSIS — Z85828 Personal history of other malignant neoplasm of skin: Secondary | ICD-10-CM | POA: Diagnosis not present

## 2018-06-18 DIAGNOSIS — D2262 Melanocytic nevi of left upper limb, including shoulder: Secondary | ICD-10-CM | POA: Diagnosis not present

## 2018-06-18 DIAGNOSIS — D225 Melanocytic nevi of trunk: Secondary | ICD-10-CM | POA: Diagnosis not present

## 2018-06-19 DIAGNOSIS — M6281 Muscle weakness (generalized): Secondary | ICD-10-CM | POA: Diagnosis not present

## 2018-06-19 DIAGNOSIS — S52124S Nondisplaced fracture of head of right radius, sequela: Secondary | ICD-10-CM | POA: Diagnosis not present

## 2018-06-19 DIAGNOSIS — M25521 Pain in right elbow: Secondary | ICD-10-CM | POA: Diagnosis not present

## 2018-06-19 DIAGNOSIS — M25621 Stiffness of right elbow, not elsewhere classified: Secondary | ICD-10-CM | POA: Diagnosis not present

## 2018-06-21 DIAGNOSIS — M6281 Muscle weakness (generalized): Secondary | ICD-10-CM | POA: Diagnosis not present

## 2018-06-21 DIAGNOSIS — S52124S Nondisplaced fracture of head of right radius, sequela: Secondary | ICD-10-CM | POA: Diagnosis not present

## 2018-06-21 DIAGNOSIS — M25621 Stiffness of right elbow, not elsewhere classified: Secondary | ICD-10-CM | POA: Diagnosis not present

## 2018-06-21 DIAGNOSIS — M25521 Pain in right elbow: Secondary | ICD-10-CM | POA: Diagnosis not present

## 2018-06-28 DIAGNOSIS — M25532 Pain in left wrist: Secondary | ICD-10-CM | POA: Insufficient documentation

## 2018-06-28 DIAGNOSIS — M25531 Pain in right wrist: Secondary | ICD-10-CM | POA: Diagnosis not present

## 2018-06-28 DIAGNOSIS — M25521 Pain in right elbow: Secondary | ICD-10-CM | POA: Diagnosis not present

## 2018-06-28 DIAGNOSIS — S52121A Displaced fracture of head of right radius, initial encounter for closed fracture: Secondary | ICD-10-CM

## 2018-06-28 DIAGNOSIS — G8929 Other chronic pain: Secondary | ICD-10-CM | POA: Insufficient documentation

## 2018-06-28 HISTORY — DX: Other chronic pain: G89.29

## 2018-06-28 HISTORY — DX: Displaced fracture of head of right radius, initial encounter for closed fracture: S52.121A

## 2018-07-25 ENCOUNTER — Encounter: Payer: Self-pay | Admitting: Physical Therapy

## 2018-08-01 ENCOUNTER — Other Ambulatory Visit: Payer: Self-pay | Admitting: Obstetrics & Gynecology

## 2018-08-01 DIAGNOSIS — Z1231 Encounter for screening mammogram for malignant neoplasm of breast: Secondary | ICD-10-CM

## 2018-08-20 DIAGNOSIS — H04123 Dry eye syndrome of bilateral lacrimal glands: Secondary | ICD-10-CM | POA: Diagnosis not present

## 2018-08-20 DIAGNOSIS — H524 Presbyopia: Secondary | ICD-10-CM | POA: Diagnosis not present

## 2018-09-18 ENCOUNTER — Ambulatory Visit: Payer: BLUE CROSS/BLUE SHIELD

## 2018-09-24 DIAGNOSIS — G8918 Other acute postprocedural pain: Secondary | ICD-10-CM | POA: Diagnosis not present

## 2018-09-24 DIAGNOSIS — M948X3 Other specified disorders of cartilage, forearm: Secondary | ICD-10-CM | POA: Diagnosis not present

## 2018-09-24 DIAGNOSIS — S52121P Displaced fracture of head of right radius, subsequent encounter for closed fracture with malunion: Secondary | ICD-10-CM | POA: Diagnosis not present

## 2018-09-24 DIAGNOSIS — S52121A Displaced fracture of head of right radius, initial encounter for closed fracture: Secondary | ICD-10-CM | POA: Diagnosis not present

## 2018-09-24 HISTORY — PX: OTHER SURGICAL HISTORY: SHX169

## 2018-10-02 DIAGNOSIS — S52121D Displaced fracture of head of right radius, subsequent encounter for closed fracture with routine healing: Secondary | ICD-10-CM | POA: Diagnosis not present

## 2018-10-04 DIAGNOSIS — M6281 Muscle weakness (generalized): Secondary | ICD-10-CM | POA: Diagnosis not present

## 2018-10-04 DIAGNOSIS — M25631 Stiffness of right wrist, not elsewhere classified: Secondary | ICD-10-CM | POA: Diagnosis not present

## 2018-10-04 DIAGNOSIS — M25621 Stiffness of right elbow, not elsewhere classified: Secondary | ICD-10-CM | POA: Diagnosis not present

## 2018-10-04 DIAGNOSIS — M25521 Pain in right elbow: Secondary | ICD-10-CM | POA: Diagnosis not present

## 2018-10-09 DIAGNOSIS — M25521 Pain in right elbow: Secondary | ICD-10-CM | POA: Diagnosis not present

## 2018-10-09 DIAGNOSIS — M25621 Stiffness of right elbow, not elsewhere classified: Secondary | ICD-10-CM | POA: Diagnosis not present

## 2018-10-09 DIAGNOSIS — M25631 Stiffness of right wrist, not elsewhere classified: Secondary | ICD-10-CM | POA: Diagnosis not present

## 2018-10-09 DIAGNOSIS — M6281 Muscle weakness (generalized): Secondary | ICD-10-CM | POA: Diagnosis not present

## 2018-10-11 DIAGNOSIS — M25521 Pain in right elbow: Secondary | ICD-10-CM | POA: Diagnosis not present

## 2018-10-11 DIAGNOSIS — M6281 Muscle weakness (generalized): Secondary | ICD-10-CM | POA: Diagnosis not present

## 2018-10-11 DIAGNOSIS — M25631 Stiffness of right wrist, not elsewhere classified: Secondary | ICD-10-CM | POA: Diagnosis not present

## 2018-10-11 DIAGNOSIS — M25621 Stiffness of right elbow, not elsewhere classified: Secondary | ICD-10-CM | POA: Diagnosis not present

## 2018-10-16 DIAGNOSIS — M25521 Pain in right elbow: Secondary | ICD-10-CM | POA: Diagnosis not present

## 2018-10-16 DIAGNOSIS — M25621 Stiffness of right elbow, not elsewhere classified: Secondary | ICD-10-CM | POA: Diagnosis not present

## 2018-10-16 DIAGNOSIS — M25631 Stiffness of right wrist, not elsewhere classified: Secondary | ICD-10-CM | POA: Diagnosis not present

## 2018-10-16 DIAGNOSIS — M6281 Muscle weakness (generalized): Secondary | ICD-10-CM | POA: Diagnosis not present

## 2018-10-18 ENCOUNTER — Ambulatory Visit (INDEPENDENT_AMBULATORY_CARE_PROVIDER_SITE_OTHER): Payer: BC Managed Care – PPO | Admitting: Family Medicine

## 2018-10-18 ENCOUNTER — Encounter: Payer: Self-pay | Admitting: Family Medicine

## 2018-10-18 ENCOUNTER — Other Ambulatory Visit: Payer: Self-pay

## 2018-10-18 VITALS — BP 122/86 | HR 95 | Temp 98.2°F | Ht 66.5 in | Wt 178.4 lb

## 2018-10-18 DIAGNOSIS — M25631 Stiffness of right wrist, not elsewhere classified: Secondary | ICD-10-CM | POA: Diagnosis not present

## 2018-10-18 DIAGNOSIS — M6281 Muscle weakness (generalized): Secondary | ICD-10-CM | POA: Diagnosis not present

## 2018-10-18 DIAGNOSIS — R739 Hyperglycemia, unspecified: Secondary | ICD-10-CM | POA: Diagnosis not present

## 2018-10-18 DIAGNOSIS — E785 Hyperlipidemia, unspecified: Secondary | ICD-10-CM | POA: Diagnosis not present

## 2018-10-18 DIAGNOSIS — K219 Gastro-esophageal reflux disease without esophagitis: Secondary | ICD-10-CM

## 2018-10-18 DIAGNOSIS — M25521 Pain in right elbow: Secondary | ICD-10-CM | POA: Diagnosis not present

## 2018-10-18 DIAGNOSIS — Z0000 Encounter for general adult medical examination without abnormal findings: Secondary | ICD-10-CM

## 2018-10-18 DIAGNOSIS — Z23 Encounter for immunization: Secondary | ICD-10-CM

## 2018-10-18 DIAGNOSIS — M25621 Stiffness of right elbow, not elsewhere classified: Secondary | ICD-10-CM | POA: Diagnosis not present

## 2018-10-18 DIAGNOSIS — Z8279 Family history of other congenital malformations, deformations and chromosomal abnormalities: Secondary | ICD-10-CM

## 2018-10-18 LAB — CBC
HCT: 41.1 % (ref 36.0–46.0)
Hemoglobin: 13.9 g/dL (ref 12.0–15.0)
MCHC: 33.8 g/dL (ref 30.0–36.0)
MCV: 97.3 fl (ref 78.0–100.0)
Platelets: 227 10*3/uL (ref 150.0–400.0)
RBC: 4.22 Mil/uL (ref 3.87–5.11)
RDW: 12.9 % (ref 11.5–15.5)
WBC: 4.5 10*3/uL (ref 4.0–10.5)

## 2018-10-18 LAB — COMPREHENSIVE METABOLIC PANEL
ALT: 18 U/L (ref 0–35)
AST: 19 U/L (ref 0–37)
Albumin: 4.6 g/dL (ref 3.5–5.2)
Alkaline Phosphatase: 83 U/L (ref 39–117)
BUN: 13 mg/dL (ref 6–23)
CO2: 30 mEq/L (ref 19–32)
Calcium: 10.3 mg/dL (ref 8.4–10.5)
Chloride: 103 mEq/L (ref 96–112)
Creatinine, Ser: 0.76 mg/dL (ref 0.40–1.20)
GFR: 77.88 mL/min (ref >60.00)
Glucose, Bld: 105 mg/dL — ABNORMAL HIGH (ref 70–99)
Potassium: 4.7 mEq/L (ref 3.5–5.1)
Sodium: 140 mEq/L (ref 135–145)
Total Bilirubin: 0.7 mg/dL (ref 0.2–1.2)
Total Protein: 6.7 g/dL (ref 6.0–8.3)

## 2018-10-18 LAB — LIPID PANEL
Cholesterol: 187 mg/dL (ref 0–200)
HDL: 77.6 mg/dL (ref >39.00)
LDL Cholesterol: 92 mg/dL (ref 0–99)
NonHDL: 109.42
Total CHOL/HDL Ratio: 2
Triglycerides: 85 mg/dL (ref 0.0–149.0)
VLDL: 17 mg/dL (ref 0.0–40.0)

## 2018-10-18 LAB — HEMOGLOBIN A1C: Hgb A1c MFr Bld: 5.8 % (ref 4.6–6.5)

## 2018-10-18 NOTE — Patient Instructions (Addendum)
Health Maintenance Due  Topic Date Due  . TETANUS/TDAP - today 11/12/2017  . INFLUENZA VACCINE - today 09/01/2018   Z82.79 is famly history bicuspid Echocardiogram ordered. We will call you within two weeks about your referral for this (would check with your insurance about coverage under this diagnosis for echocardiogram). If you do not hear within 3 weeks, give [place] a call.   With chronic cough- please let me know if you would like to see pulmonology  Please stop by lab before you go If you do not have mychart- we will call you about results within 5 business days of [place] receiving them.  If you have mychart- we will send your results within 3 business days of [place] receiving them.  If abnormal or we want to clarify a result, we will call or mychart you to make sure you receive the message.  If you have questions or concerns or don't hear within 5-7 days, please send [place] a message or call us.

## 2018-10-18 NOTE — Addendum Note (Signed)
Addended by: Jasper Loser on: 10/18/2018 08:55 AM   Modules accepted: Orders
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
## 2018-10-23 DIAGNOSIS — M25521 Pain in right elbow: Secondary | ICD-10-CM | POA: Diagnosis not present

## 2018-10-23 DIAGNOSIS — M25631 Stiffness of right wrist, not elsewhere classified: Secondary | ICD-10-CM | POA: Diagnosis not present

## 2018-10-23 DIAGNOSIS — M6281 Muscle weakness (generalized): Secondary | ICD-10-CM | POA: Diagnosis not present

## 2018-10-23 DIAGNOSIS — M25621 Stiffness of right elbow, not elsewhere classified: Secondary | ICD-10-CM | POA: Diagnosis not present

## 2018-10-23 DIAGNOSIS — S52121D Displaced fracture of head of right radius, subsequent encounter for closed fracture with routine healing: Secondary | ICD-10-CM | POA: Diagnosis not present

## 2018-10-25 DIAGNOSIS — M25621 Stiffness of right elbow, not elsewhere classified: Secondary | ICD-10-CM | POA: Diagnosis not present

## 2018-10-25 DIAGNOSIS — M6281 Muscle weakness (generalized): Secondary | ICD-10-CM | POA: Diagnosis not present

## 2018-10-25 DIAGNOSIS — M25521 Pain in right elbow: Secondary | ICD-10-CM | POA: Diagnosis not present

## 2018-10-25 DIAGNOSIS — M25631 Stiffness of right wrist, not elsewhere classified: Secondary | ICD-10-CM | POA: Diagnosis not present

## 2018-10-29 ENCOUNTER — Ambulatory Visit
Admission: RE | Admit: 2018-10-29 | Discharge: 2018-10-29 | Disposition: A | Discharge status: Home / Self Care | Payer: BLUE CROSS/BLUE SHIELD | Source: Ambulatory Visit | Attending: Obstetrics & Gynecology | Admitting: Obstetrics & Gynecology

## 2018-10-29 ENCOUNTER — Other Ambulatory Visit: Payer: Self-pay

## 2018-10-29 DIAGNOSIS — Z1231 Encounter for screening mammogram for malignant neoplasm of breast: Secondary | ICD-10-CM

## 2018-10-30 ENCOUNTER — Encounter: Payer: Self-pay | Admitting: Gynecology

## 2018-10-30 DIAGNOSIS — M25631 Stiffness of right wrist, not elsewhere classified: Secondary | ICD-10-CM | POA: Diagnosis not present

## 2018-10-30 DIAGNOSIS — M25521 Pain in right elbow: Secondary | ICD-10-CM | POA: Diagnosis not present

## 2018-10-30 DIAGNOSIS — M6281 Muscle weakness (generalized): Secondary | ICD-10-CM | POA: Diagnosis not present

## 2018-10-30 DIAGNOSIS — M25621 Stiffness of right elbow, not elsewhere classified: Secondary | ICD-10-CM | POA: Diagnosis not present

## 2018-11-01 DIAGNOSIS — M25631 Stiffness of right wrist, not elsewhere classified: Secondary | ICD-10-CM | POA: Diagnosis not present

## 2018-11-01 DIAGNOSIS — M6281 Muscle weakness (generalized): Secondary | ICD-10-CM | POA: Diagnosis not present

## 2018-11-01 DIAGNOSIS — M25621 Stiffness of right elbow, not elsewhere classified: Secondary | ICD-10-CM | POA: Diagnosis not present

## 2018-11-01 DIAGNOSIS — M25521 Pain in right elbow: Secondary | ICD-10-CM | POA: Diagnosis not present

## 2018-11-06 DIAGNOSIS — M25521 Pain in right elbow: Secondary | ICD-10-CM | POA: Diagnosis not present

## 2018-11-06 DIAGNOSIS — M25631 Stiffness of right wrist, not elsewhere classified: Secondary | ICD-10-CM | POA: Diagnosis not present

## 2018-11-06 DIAGNOSIS — M25621 Stiffness of right elbow, not elsewhere classified: Secondary | ICD-10-CM | POA: Diagnosis not present

## 2018-11-06 DIAGNOSIS — M6281 Muscle weakness (generalized): Secondary | ICD-10-CM | POA: Diagnosis not present

## 2018-11-08 DIAGNOSIS — M25521 Pain in right elbow: Secondary | ICD-10-CM | POA: Diagnosis not present

## 2018-11-08 DIAGNOSIS — M25621 Stiffness of right elbow, not elsewhere classified: Secondary | ICD-10-CM | POA: Diagnosis not present

## 2018-11-08 DIAGNOSIS — M25631 Stiffness of right wrist, not elsewhere classified: Secondary | ICD-10-CM | POA: Diagnosis not present

## 2018-11-08 DIAGNOSIS — M6281 Muscle weakness (generalized): Secondary | ICD-10-CM | POA: Diagnosis not present

## 2018-11-13 DIAGNOSIS — M25521 Pain in right elbow: Secondary | ICD-10-CM | POA: Diagnosis not present

## 2018-11-13 DIAGNOSIS — M25621 Stiffness of right elbow, not elsewhere classified: Secondary | ICD-10-CM | POA: Diagnosis not present

## 2018-11-13 DIAGNOSIS — M25631 Stiffness of right wrist, not elsewhere classified: Secondary | ICD-10-CM | POA: Diagnosis not present

## 2018-11-13 DIAGNOSIS — M6281 Muscle weakness (generalized): Secondary | ICD-10-CM | POA: Diagnosis not present

## 2018-11-19 ENCOUNTER — Other Ambulatory Visit: Payer: Self-pay

## 2018-11-19 ENCOUNTER — Ambulatory Visit (HOSPITAL_COMMUNITY): Payer: BC Managed Care – PPO | Attending: Cardiovascular Disease

## 2018-11-19 DIAGNOSIS — Z8279 Family history of other congenital malformations, deformations and chromosomal abnormalities: Secondary | ICD-10-CM | POA: Insufficient documentation

## 2018-11-20 DIAGNOSIS — M25521 Pain in right elbow: Secondary | ICD-10-CM | POA: Diagnosis not present

## 2018-11-20 DIAGNOSIS — M25631 Stiffness of right wrist, not elsewhere classified: Secondary | ICD-10-CM | POA: Diagnosis not present

## 2018-11-20 DIAGNOSIS — M25621 Stiffness of right elbow, not elsewhere classified: Secondary | ICD-10-CM | POA: Diagnosis not present

## 2018-11-20 DIAGNOSIS — M6281 Muscle weakness (generalized): Secondary | ICD-10-CM | POA: Diagnosis not present

## 2018-11-26 DIAGNOSIS — L570 Actinic keratosis: Secondary | ICD-10-CM | POA: Diagnosis not present

## 2018-11-26 DIAGNOSIS — C44311 Basal cell carcinoma of skin of nose: Secondary | ICD-10-CM | POA: Diagnosis not present

## 2018-11-27 DIAGNOSIS — M25521 Pain in right elbow: Secondary | ICD-10-CM | POA: Diagnosis not present

## 2018-11-27 DIAGNOSIS — M25531 Pain in right wrist: Secondary | ICD-10-CM | POA: Diagnosis not present

## 2018-11-27 DIAGNOSIS — M25561 Pain in right knee: Secondary | ICD-10-CM | POA: Diagnosis not present

## 2018-12-11 ENCOUNTER — Encounter: Payer: Self-pay | Admitting: Physical Therapy

## 2018-12-12 DIAGNOSIS — M79631 Pain in right forearm: Secondary | ICD-10-CM | POA: Diagnosis not present

## 2018-12-14 DIAGNOSIS — M25521 Pain in right elbow: Secondary | ICD-10-CM | POA: Diagnosis not present

## 2018-12-17 DIAGNOSIS — S53001A Unspecified subluxation of right radial head, initial encounter: Secondary | ICD-10-CM | POA: Diagnosis not present

## 2018-12-17 DIAGNOSIS — T849XXA Unspecified complication of internal orthopedic prosthetic device, implant and graft, initial encounter: Secondary | ICD-10-CM | POA: Diagnosis not present

## 2018-12-17 DIAGNOSIS — Y999 Unspecified external cause status: Secondary | ICD-10-CM | POA: Diagnosis not present

## 2018-12-17 DIAGNOSIS — X58XXXA Exposure to other specified factors, initial encounter: Secondary | ICD-10-CM | POA: Diagnosis not present

## 2018-12-17 DIAGNOSIS — G8918 Other acute postprocedural pain: Secondary | ICD-10-CM | POA: Diagnosis not present

## 2018-12-25 DIAGNOSIS — T849XXA Unspecified complication of internal orthopedic prosthetic device, implant and graft, initial encounter: Secondary | ICD-10-CM | POA: Diagnosis not present

## 2019-01-01 DIAGNOSIS — T849XXA Unspecified complication of internal orthopedic prosthetic device, implant and graft, initial encounter: Secondary | ICD-10-CM | POA: Diagnosis not present

## 2019-01-08 DIAGNOSIS — M6281 Muscle weakness (generalized): Secondary | ICD-10-CM | POA: Diagnosis not present

## 2019-01-08 DIAGNOSIS — M25621 Stiffness of right elbow, not elsewhere classified: Secondary | ICD-10-CM | POA: Diagnosis not present

## 2019-01-08 DIAGNOSIS — M25521 Pain in right elbow: Secondary | ICD-10-CM | POA: Diagnosis not present

## 2019-01-08 DIAGNOSIS — M25631 Stiffness of right wrist, not elsewhere classified: Secondary | ICD-10-CM | POA: Diagnosis not present

## 2019-01-10 DIAGNOSIS — M25521 Pain in right elbow: Secondary | ICD-10-CM | POA: Diagnosis not present

## 2019-01-10 DIAGNOSIS — M6281 Muscle weakness (generalized): Secondary | ICD-10-CM | POA: Diagnosis not present

## 2019-01-10 DIAGNOSIS — M25631 Stiffness of right wrist, not elsewhere classified: Secondary | ICD-10-CM | POA: Diagnosis not present

## 2019-01-10 DIAGNOSIS — M25621 Stiffness of right elbow, not elsewhere classified: Secondary | ICD-10-CM | POA: Diagnosis not present

## 2019-01-15 DIAGNOSIS — M6281 Muscle weakness (generalized): Secondary | ICD-10-CM | POA: Diagnosis not present

## 2019-01-15 DIAGNOSIS — M25621 Stiffness of right elbow, not elsewhere classified: Secondary | ICD-10-CM | POA: Diagnosis not present

## 2019-01-15 DIAGNOSIS — M25521 Pain in right elbow: Secondary | ICD-10-CM | POA: Diagnosis not present

## 2019-01-15 DIAGNOSIS — M25631 Stiffness of right wrist, not elsewhere classified: Secondary | ICD-10-CM | POA: Diagnosis not present

## 2019-01-17 DIAGNOSIS — M25631 Stiffness of right wrist, not elsewhere classified: Secondary | ICD-10-CM | POA: Diagnosis not present

## 2019-01-17 DIAGNOSIS — M25521 Pain in right elbow: Secondary | ICD-10-CM | POA: Diagnosis not present

## 2019-01-17 DIAGNOSIS — M25621 Stiffness of right elbow, not elsewhere classified: Secondary | ICD-10-CM | POA: Diagnosis not present

## 2019-01-17 DIAGNOSIS — M6281 Muscle weakness (generalized): Secondary | ICD-10-CM | POA: Diagnosis not present

## 2019-01-21 DIAGNOSIS — C44311 Basal cell carcinoma of skin of nose: Secondary | ICD-10-CM | POA: Diagnosis not present

## 2019-01-22 DIAGNOSIS — M25631 Stiffness of right wrist, not elsewhere classified: Secondary | ICD-10-CM | POA: Diagnosis not present

## 2019-01-22 DIAGNOSIS — M25621 Stiffness of right elbow, not elsewhere classified: Secondary | ICD-10-CM | POA: Diagnosis not present

## 2019-01-22 DIAGNOSIS — M25521 Pain in right elbow: Secondary | ICD-10-CM | POA: Diagnosis not present

## 2019-01-22 DIAGNOSIS — M6281 Muscle weakness (generalized): Secondary | ICD-10-CM | POA: Diagnosis not present

## 2019-01-24 DIAGNOSIS — M25521 Pain in right elbow: Secondary | ICD-10-CM | POA: Diagnosis not present

## 2019-01-24 DIAGNOSIS — M25621 Stiffness of right elbow, not elsewhere classified: Secondary | ICD-10-CM | POA: Diagnosis not present

## 2019-01-24 DIAGNOSIS — M25631 Stiffness of right wrist, not elsewhere classified: Secondary | ICD-10-CM | POA: Diagnosis not present

## 2019-01-24 DIAGNOSIS — M6281 Muscle weakness (generalized): Secondary | ICD-10-CM | POA: Diagnosis not present

## 2019-01-29 DIAGNOSIS — M25631 Stiffness of right wrist, not elsewhere classified: Secondary | ICD-10-CM | POA: Diagnosis not present

## 2019-01-29 DIAGNOSIS — M25621 Stiffness of right elbow, not elsewhere classified: Secondary | ICD-10-CM | POA: Diagnosis not present

## 2019-01-29 DIAGNOSIS — M25521 Pain in right elbow: Secondary | ICD-10-CM | POA: Diagnosis not present

## 2019-01-29 DIAGNOSIS — M6281 Muscle weakness (generalized): Secondary | ICD-10-CM | POA: Diagnosis not present

## 2019-01-31 DIAGNOSIS — M25521 Pain in right elbow: Secondary | ICD-10-CM | POA: Diagnosis not present

## 2019-01-31 DIAGNOSIS — M25631 Stiffness of right wrist, not elsewhere classified: Secondary | ICD-10-CM | POA: Diagnosis not present

## 2019-01-31 DIAGNOSIS — M6281 Muscle weakness (generalized): Secondary | ICD-10-CM | POA: Diagnosis not present

## 2019-01-31 DIAGNOSIS — M25621 Stiffness of right elbow, not elsewhere classified: Secondary | ICD-10-CM | POA: Diagnosis not present

## 2019-02-05 DIAGNOSIS — T849XXD Unspecified complication of internal orthopedic prosthetic device, implant and graft, subsequent encounter: Secondary | ICD-10-CM | POA: Diagnosis not present

## 2019-02-21 DIAGNOSIS — Z4801 Encounter for change or removal of surgical wound dressing: Secondary | ICD-10-CM | POA: Diagnosis not present

## 2019-03-12 DIAGNOSIS — T849XXD Unspecified complication of internal orthopedic prosthetic device, implant and graft, subsequent encounter: Secondary | ICD-10-CM | POA: Diagnosis not present

## 2019-03-21 ENCOUNTER — Encounter: Payer: BLUE CROSS/BLUE SHIELD | Admitting: Obstetrics & Gynecology

## 2019-04-12 DIAGNOSIS — M1711 Unilateral primary osteoarthritis, right knee: Secondary | ICD-10-CM | POA: Diagnosis not present

## 2019-05-16 DIAGNOSIS — M9904 Segmental and somatic dysfunction of sacral region: Secondary | ICD-10-CM | POA: Diagnosis not present

## 2019-05-16 DIAGNOSIS — M5417 Radiculopathy, lumbosacral region: Secondary | ICD-10-CM | POA: Diagnosis not present

## 2019-05-16 DIAGNOSIS — M9903 Segmental and somatic dysfunction of lumbar region: Secondary | ICD-10-CM | POA: Diagnosis not present

## 2019-05-16 DIAGNOSIS — M5136 Other intervertebral disc degeneration, lumbar region: Secondary | ICD-10-CM | POA: Diagnosis not present

## 2019-05-20 DIAGNOSIS — M9903 Segmental and somatic dysfunction of lumbar region: Secondary | ICD-10-CM | POA: Diagnosis not present

## 2019-05-20 DIAGNOSIS — M5417 Radiculopathy, lumbosacral region: Secondary | ICD-10-CM | POA: Diagnosis not present

## 2019-05-20 DIAGNOSIS — M9904 Segmental and somatic dysfunction of sacral region: Secondary | ICD-10-CM | POA: Diagnosis not present

## 2019-05-20 DIAGNOSIS — M5136 Other intervertebral disc degeneration, lumbar region: Secondary | ICD-10-CM | POA: Diagnosis not present

## 2019-05-23 ENCOUNTER — Encounter: Payer: Self-pay | Admitting: Obstetrics & Gynecology

## 2019-05-23 DIAGNOSIS — M5417 Radiculopathy, lumbosacral region: Secondary | ICD-10-CM | POA: Diagnosis not present

## 2019-05-23 DIAGNOSIS — M9904 Segmental and somatic dysfunction of sacral region: Secondary | ICD-10-CM | POA: Diagnosis not present

## 2019-05-23 DIAGNOSIS — M5136 Other intervertebral disc degeneration, lumbar region: Secondary | ICD-10-CM | POA: Diagnosis not present

## 2019-05-23 DIAGNOSIS — M9903 Segmental and somatic dysfunction of lumbar region: Secondary | ICD-10-CM | POA: Diagnosis not present

## 2019-05-27 DIAGNOSIS — M9904 Segmental and somatic dysfunction of sacral region: Secondary | ICD-10-CM | POA: Diagnosis not present

## 2019-05-27 DIAGNOSIS — M5417 Radiculopathy, lumbosacral region: Secondary | ICD-10-CM | POA: Diagnosis not present

## 2019-05-27 DIAGNOSIS — M5136 Other intervertebral disc degeneration, lumbar region: Secondary | ICD-10-CM | POA: Diagnosis not present

## 2019-05-27 DIAGNOSIS — M9903 Segmental and somatic dysfunction of lumbar region: Secondary | ICD-10-CM | POA: Diagnosis not present

## 2019-05-30 DIAGNOSIS — M9904 Segmental and somatic dysfunction of sacral region: Secondary | ICD-10-CM | POA: Diagnosis not present

## 2019-05-30 DIAGNOSIS — M5136 Other intervertebral disc degeneration, lumbar region: Secondary | ICD-10-CM | POA: Diagnosis not present

## 2019-05-30 DIAGNOSIS — M5417 Radiculopathy, lumbosacral region: Secondary | ICD-10-CM | POA: Diagnosis not present

## 2019-05-30 DIAGNOSIS — M9903 Segmental and somatic dysfunction of lumbar region: Secondary | ICD-10-CM | POA: Diagnosis not present

## 2019-06-03 DIAGNOSIS — M5417 Radiculopathy, lumbosacral region: Secondary | ICD-10-CM | POA: Diagnosis not present

## 2019-06-03 DIAGNOSIS — M9903 Segmental and somatic dysfunction of lumbar region: Secondary | ICD-10-CM | POA: Diagnosis not present

## 2019-06-03 DIAGNOSIS — M5136 Other intervertebral disc degeneration, lumbar region: Secondary | ICD-10-CM | POA: Diagnosis not present

## 2019-06-03 DIAGNOSIS — M9904 Segmental and somatic dysfunction of sacral region: Secondary | ICD-10-CM | POA: Diagnosis not present

## 2019-06-11 DIAGNOSIS — M9903 Segmental and somatic dysfunction of lumbar region: Secondary | ICD-10-CM | POA: Diagnosis not present

## 2019-06-11 DIAGNOSIS — M5417 Radiculopathy, lumbosacral region: Secondary | ICD-10-CM | POA: Diagnosis not present

## 2019-06-11 DIAGNOSIS — M9904 Segmental and somatic dysfunction of sacral region: Secondary | ICD-10-CM | POA: Diagnosis not present

## 2019-06-11 DIAGNOSIS — M5136 Other intervertebral disc degeneration, lumbar region: Secondary | ICD-10-CM | POA: Diagnosis not present

## 2019-06-17 DIAGNOSIS — D2261 Melanocytic nevi of right upper limb, including shoulder: Secondary | ICD-10-CM | POA: Diagnosis not present

## 2019-06-17 DIAGNOSIS — Z85828 Personal history of other malignant neoplasm of skin: Secondary | ICD-10-CM | POA: Diagnosis not present

## 2019-06-17 DIAGNOSIS — D225 Melanocytic nevi of trunk: Secondary | ICD-10-CM | POA: Diagnosis not present

## 2019-06-17 DIAGNOSIS — C4441 Basal cell carcinoma of skin of scalp and neck: Secondary | ICD-10-CM | POA: Diagnosis not present

## 2019-06-17 DIAGNOSIS — D2262 Melanocytic nevi of left upper limb, including shoulder: Secondary | ICD-10-CM | POA: Diagnosis not present

## 2019-06-26 ENCOUNTER — Other Ambulatory Visit: Payer: Self-pay

## 2019-06-27 ENCOUNTER — Encounter: Payer: Self-pay | Admitting: Obstetrics & Gynecology

## 2019-06-27 ENCOUNTER — Ambulatory Visit (INDEPENDENT_AMBULATORY_CARE_PROVIDER_SITE_OTHER): Payer: BC Managed Care – PPO | Admitting: Obstetrics & Gynecology

## 2019-06-27 VITALS — BP 120/70 | Ht 66.5 in | Wt 171.0 lb

## 2019-06-27 DIAGNOSIS — E663 Overweight: Secondary | ICD-10-CM

## 2019-06-27 DIAGNOSIS — Z01419 Encounter for gynecological examination (general) (routine) without abnormal findings: Secondary | ICD-10-CM | POA: Diagnosis not present

## 2019-06-27 DIAGNOSIS — Z780 Asymptomatic menopausal state: Secondary | ICD-10-CM | POA: Diagnosis not present

## 2019-06-27 NOTE — Patient Instructions (Signed)
1. Well female exam with routine gynecological exam Normal gynecologic exam.  Patient declines Pap test this year.  Also declines STD screening.  Breast exam normal.  Will schedule screening mammogram.  Health labs with family physician.  Colonoscopy 2012.  2. Postmenopausal Well on no hormone replacement therapy.  No postmenopausal bleeding.  Satisfied with Replens for vaginal dryness.  Bone density February 2019 was normal.  3. Overweight (BMI 25.0-29.9) Improved body mass index since last year.  Continue on a lower calorie/carb diet and fitness.  Other orders - Vaginal Lubricant (REPLENS) GEL; Place vaginally.  Jacqueline Molina, it was a pleasure seeing you today!

## 2019-06-27 NOTE — Progress Notes (Signed)
Jacqueline Molina [birth year] [id number]   History:    59 y.o. G1P1L1  Divorced.  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  RP:  Established patient presenting for annual gyn exam   HPI: Menopause, well on no HRT.  No PMB.  No pelvic pain.  No pain with IC. Satisfied with Replens.  Breasts normal.  Urine/BMs normal.  BMI 27.19.  Health labs with Fam MD.  [place] 2012.   Past medical history,surgical history, family history and social history were all reviewed and documented in the EPIC chart.  Gynecologic History No LMP recorded. Patient is postmenopausal.  Obstetric History OB History  Gravida Para Term Preterm AB Living  1 1       1   SAB TAB Ectopic Multiple Live Births               # Outcome Date GA Lbr Len/2nd Weight Sex Delivery Anes PTL Lv  1 Para              ROS: A ROS was performed and pertinent positives and negatives are included in the history.  GENERAL: No fevers or chills. HEENT: No change in vision, no earache, sore throat or sinus congestion. NECK: No pain or stiffness. CARDIOVASCULAR: No chest pain or pressure. No palpitations. PULMONARY: No shortness of breath, cough or wheeze. GASTROINTESTINAL: No abdominal pain, nausea, vomiting or diarrhea, melena or bright red blood per rectum. GENITOURINARY: No urinary frequency, urgency, hesitancy or dysuria. MUSCULOSKELETAL: No joint or muscle pain, no back pain, no recent trauma. DERMATOLOGIC: No rash, no itching, no lesions. ENDOCRINE: No polyuria, polydipsia, no heat or cold intolerance. No recent change in weight. HEMATOLOGICAL: No anemia or easy bruising or bleeding. NEUROLOGIC: No headache, seizures, numbness, tingling or weakness. PSYCHIATRIC: No depression, no loss of interest in normal activity or change in sleep pattern.     Exam:   BP 120/70   Ht 5' 6.5" (1.689 m)   Wt 171 lb (77.6 kg)   BMI 27.19 kg/m   Body mass index is 27.19 kg/m.  General appearance : Well developed well nourished female. No acute  distress HEENT: Eyes: no retinal hemorrhage or exudates,  Neck supple, trachea midline, no carotid bruits, no thyroidmegaly Lungs: Clear to auscultation, no rhonchi or wheezes, or rib retractions  Heart: Regular rate and rhythm, no murmurs or gallops Breast:Examined in sitting and supine position were symmetrical in appearance, no palpable masses or tenderness,  no skin retraction, no nipple inversion, no nipple discharge, no skin discoloration, no axillary or supraclavicular lymphadenopathy Abdomen: no palpable masses or tenderness, no rebound or guarding Extremities: no edema or skin discoloration or tenderness  Pelvic: Vulva: Normal             Vagina: No gross lesions or discharge  Cervix: No gross lesions or discharge  Uterus  AV, normal size, shape and consistency, non-tender and mobile  Adnexa  Without masses or tenderness  Anus: Normal   Assessment/Plan:  59 y.o. female for annual exam   1. Well female exam with routine gynecological exam Normal gynecologic exam.  Patient declines Pap test this year.  Also declines STD screening.  Breast exam normal.  Will schedule screening mammogram.  Health labs with family physician.  Colonoscopy 2012.  2. Postmenopausal Well on no hormone replacement therapy.  No postmenopausal bleeding.  Satisfied with Replens for vaginal dryness.  Bone density February 2019 was normal.  3. Overweight (BMI 25.0-29.9) Improved body mass index since last year.  Continue on a lower calorie/carb diet and fitness.  Other orders - Vaginal Lubricant (REPLENS) GEL; Place vaginally.  Princess Bruins MD, 11:55 AM 06/27/2019

## 2019-10-08 ENCOUNTER — Other Ambulatory Visit: Payer: Self-pay | Admitting: Obstetrics & Gynecology

## 2019-10-08 DIAGNOSIS — Z1231 Encounter for screening mammogram for malignant neoplasm of breast: Secondary | ICD-10-CM

## 2019-10-17 DIAGNOSIS — M1711 Unilateral primary osteoarthritis, right knee: Secondary | ICD-10-CM | POA: Diagnosis not present

## 2019-10-23 DIAGNOSIS — M9904 Segmental and somatic dysfunction of sacral region: Secondary | ICD-10-CM | POA: Diagnosis not present

## 2019-10-23 DIAGNOSIS — M9903 Segmental and somatic dysfunction of lumbar region: Secondary | ICD-10-CM | POA: Diagnosis not present

## 2019-10-23 DIAGNOSIS — M5417 Radiculopathy, lumbosacral region: Secondary | ICD-10-CM | POA: Diagnosis not present

## 2019-10-23 DIAGNOSIS — M5136 Other intervertebral disc degeneration, lumbar region: Secondary | ICD-10-CM | POA: Diagnosis not present

## 2019-10-25 DIAGNOSIS — M5417 Radiculopathy, lumbosacral region: Secondary | ICD-10-CM | POA: Diagnosis not present

## 2019-10-25 DIAGNOSIS — M9904 Segmental and somatic dysfunction of sacral region: Secondary | ICD-10-CM | POA: Diagnosis not present

## 2019-10-25 DIAGNOSIS — M5136 Other intervertebral disc degeneration, lumbar region: Secondary | ICD-10-CM | POA: Diagnosis not present

## 2019-10-25 DIAGNOSIS — M9903 Segmental and somatic dysfunction of lumbar region: Secondary | ICD-10-CM | POA: Diagnosis not present

## 2019-10-28 DIAGNOSIS — M5136 Other intervertebral disc degeneration, lumbar region: Secondary | ICD-10-CM | POA: Diagnosis not present

## 2019-10-28 DIAGNOSIS — M5417 Radiculopathy, lumbosacral region: Secondary | ICD-10-CM | POA: Diagnosis not present

## 2019-10-28 DIAGNOSIS — M9903 Segmental and somatic dysfunction of lumbar region: Secondary | ICD-10-CM | POA: Diagnosis not present

## 2019-10-28 DIAGNOSIS — M9904 Segmental and somatic dysfunction of sacral region: Secondary | ICD-10-CM | POA: Diagnosis not present

## 2019-10-30 ENCOUNTER — Ambulatory Visit: Payer: BC Managed Care – PPO

## 2019-11-08 DIAGNOSIS — M25521 Pain in right elbow: Secondary | ICD-10-CM | POA: Diagnosis not present

## 2019-11-25 ENCOUNTER — Ambulatory Visit: Payer: BC Managed Care – PPO

## 2019-12-02 DIAGNOSIS — S0501XA Injury of conjunctiva and corneal abrasion without foreign body, right eye, initial encounter: Secondary | ICD-10-CM | POA: Diagnosis not present

## 2019-12-04 DIAGNOSIS — H43811 Vitreous degeneration, right eye: Secondary | ICD-10-CM | POA: Diagnosis not present

## 2019-12-13 ENCOUNTER — Ambulatory Visit
Admission: RE | Admit: 2019-12-13 | Discharge: 2019-12-13 | Disposition: A | Discharge status: Home / Self Care | Payer: BC Managed Care – PPO | Source: Ambulatory Visit | Attending: Obstetrics & Gynecology | Admitting: Obstetrics & Gynecology

## 2019-12-13 ENCOUNTER — Other Ambulatory Visit: Payer: Self-pay

## 2019-12-13 DIAGNOSIS — Z1231 Encounter for screening mammogram for malignant neoplasm of breast: Secondary | ICD-10-CM

## 2019-12-18 DIAGNOSIS — S0501XA Injury of conjunctiva and corneal abrasion without foreign body, right eye, initial encounter: Secondary | ICD-10-CM | POA: Diagnosis not present

## 2019-12-18 DIAGNOSIS — T1511XA Foreign body in conjunctival sac, right eye, initial encounter: Secondary | ICD-10-CM | POA: Diagnosis not present

## 2019-12-18 DIAGNOSIS — H43811 Vitreous degeneration, right eye: Secondary | ICD-10-CM | POA: Diagnosis not present

## 2019-12-19 DIAGNOSIS — D2261 Melanocytic nevi of right upper limb, including shoulder: Secondary | ICD-10-CM | POA: Diagnosis not present

## 2019-12-19 DIAGNOSIS — D225 Melanocytic nevi of trunk: Secondary | ICD-10-CM | POA: Diagnosis not present

## 2019-12-19 DIAGNOSIS — Z85828 Personal history of other malignant neoplasm of skin: Secondary | ICD-10-CM | POA: Diagnosis not present

## 2019-12-19 DIAGNOSIS — L820 Inflamed seborrheic keratosis: Secondary | ICD-10-CM | POA: Diagnosis not present

## 2019-12-19 DIAGNOSIS — D2262 Melanocytic nevi of left upper limb, including shoulder: Secondary | ICD-10-CM | POA: Diagnosis not present

## 2020-01-28 DIAGNOSIS — M5417 Radiculopathy, lumbosacral region: Secondary | ICD-10-CM | POA: Diagnosis not present

## 2020-01-28 DIAGNOSIS — M5136 Other intervertebral disc degeneration, lumbar region: Secondary | ICD-10-CM | POA: Diagnosis not present

## 2020-01-28 DIAGNOSIS — M9904 Segmental and somatic dysfunction of sacral region: Secondary | ICD-10-CM | POA: Diagnosis not present

## 2020-01-28 DIAGNOSIS — M9903 Segmental and somatic dysfunction of lumbar region: Secondary | ICD-10-CM | POA: Diagnosis not present

## 2020-01-29 DIAGNOSIS — M5136 Other intervertebral disc degeneration, lumbar region: Secondary | ICD-10-CM | POA: Diagnosis not present

## 2020-01-29 DIAGNOSIS — M5417 Radiculopathy, lumbosacral region: Secondary | ICD-10-CM | POA: Diagnosis not present

## 2020-01-29 DIAGNOSIS — M9903 Segmental and somatic dysfunction of lumbar region: Secondary | ICD-10-CM | POA: Diagnosis not present

## 2020-01-29 DIAGNOSIS — M9904 Segmental and somatic dysfunction of sacral region: Secondary | ICD-10-CM | POA: Diagnosis not present

## 2020-01-30 DIAGNOSIS — M9903 Segmental and somatic dysfunction of lumbar region: Secondary | ICD-10-CM | POA: Diagnosis not present

## 2020-01-30 DIAGNOSIS — M9904 Segmental and somatic dysfunction of sacral region: Secondary | ICD-10-CM | POA: Diagnosis not present

## 2020-01-30 DIAGNOSIS — M5136 Other intervertebral disc degeneration, lumbar region: Secondary | ICD-10-CM | POA: Diagnosis not present

## 2020-01-30 DIAGNOSIS — M5417 Radiculopathy, lumbosacral region: Secondary | ICD-10-CM | POA: Diagnosis not present

## 2020-02-03 DIAGNOSIS — M5417 Radiculopathy, lumbosacral region: Secondary | ICD-10-CM | POA: Diagnosis not present

## 2020-02-03 DIAGNOSIS — M5136 Other intervertebral disc degeneration, lumbar region: Secondary | ICD-10-CM | POA: Diagnosis not present

## 2020-02-03 DIAGNOSIS — M9903 Segmental and somatic dysfunction of lumbar region: Secondary | ICD-10-CM | POA: Diagnosis not present

## 2020-02-03 DIAGNOSIS — M9904 Segmental and somatic dysfunction of sacral region: Secondary | ICD-10-CM | POA: Diagnosis not present

## 2020-02-05 DIAGNOSIS — M9903 Segmental and somatic dysfunction of lumbar region: Secondary | ICD-10-CM | POA: Diagnosis not present

## 2020-02-05 DIAGNOSIS — M5417 Radiculopathy, lumbosacral region: Secondary | ICD-10-CM | POA: Diagnosis not present

## 2020-02-05 DIAGNOSIS — M5136 Other intervertebral disc degeneration, lumbar region: Secondary | ICD-10-CM | POA: Diagnosis not present

## 2020-02-05 DIAGNOSIS — M9904 Segmental and somatic dysfunction of sacral region: Secondary | ICD-10-CM | POA: Diagnosis not present

## 2020-02-07 DIAGNOSIS — M9903 Segmental and somatic dysfunction of lumbar region: Secondary | ICD-10-CM | POA: Diagnosis not present

## 2020-02-07 DIAGNOSIS — M9904 Segmental and somatic dysfunction of sacral region: Secondary | ICD-10-CM | POA: Diagnosis not present

## 2020-02-07 DIAGNOSIS — M5136 Other intervertebral disc degeneration, lumbar region: Secondary | ICD-10-CM | POA: Diagnosis not present

## 2020-02-07 DIAGNOSIS — M5417 Radiculopathy, lumbosacral region: Secondary | ICD-10-CM | POA: Diagnosis not present

## 2020-02-11 DIAGNOSIS — M9904 Segmental and somatic dysfunction of sacral region: Secondary | ICD-10-CM | POA: Diagnosis not present

## 2020-02-11 DIAGNOSIS — M9903 Segmental and somatic dysfunction of lumbar region: Secondary | ICD-10-CM | POA: Diagnosis not present

## 2020-02-11 DIAGNOSIS — M5136 Other intervertebral disc degeneration, lumbar region: Secondary | ICD-10-CM | POA: Diagnosis not present

## 2020-02-11 DIAGNOSIS — M5417 Radiculopathy, lumbosacral region: Secondary | ICD-10-CM | POA: Diagnosis not present

## 2020-02-13 DIAGNOSIS — M5417 Radiculopathy, lumbosacral region: Secondary | ICD-10-CM | POA: Diagnosis not present

## 2020-02-13 DIAGNOSIS — M9904 Segmental and somatic dysfunction of sacral region: Secondary | ICD-10-CM | POA: Diagnosis not present

## 2020-02-13 DIAGNOSIS — M9903 Segmental and somatic dysfunction of lumbar region: Secondary | ICD-10-CM | POA: Diagnosis not present

## 2020-02-13 DIAGNOSIS — M5136 Other intervertebral disc degeneration, lumbar region: Secondary | ICD-10-CM | POA: Diagnosis not present

## 2020-02-18 DIAGNOSIS — M9903 Segmental and somatic dysfunction of lumbar region: Secondary | ICD-10-CM | POA: Diagnosis not present

## 2020-02-18 DIAGNOSIS — M9904 Segmental and somatic dysfunction of sacral region: Secondary | ICD-10-CM | POA: Diagnosis not present

## 2020-02-18 DIAGNOSIS — M5136 Other intervertebral disc degeneration, lumbar region: Secondary | ICD-10-CM | POA: Diagnosis not present

## 2020-02-18 DIAGNOSIS — M5417 Radiculopathy, lumbosacral region: Secondary | ICD-10-CM | POA: Diagnosis not present

## 2020-02-25 DIAGNOSIS — M5136 Other intervertebral disc degeneration, lumbar region: Secondary | ICD-10-CM | POA: Diagnosis not present

## 2020-02-25 DIAGNOSIS — M9904 Segmental and somatic dysfunction of sacral region: Secondary | ICD-10-CM | POA: Diagnosis not present

## 2020-02-25 DIAGNOSIS — M5417 Radiculopathy, lumbosacral region: Secondary | ICD-10-CM | POA: Diagnosis not present

## 2020-02-25 DIAGNOSIS — M9903 Segmental and somatic dysfunction of lumbar region: Secondary | ICD-10-CM | POA: Diagnosis not present

## 2020-03-03 DIAGNOSIS — M5136 Other intervertebral disc degeneration, lumbar region: Secondary | ICD-10-CM | POA: Diagnosis not present

## 2020-03-03 DIAGNOSIS — M9903 Segmental and somatic dysfunction of lumbar region: Secondary | ICD-10-CM | POA: Diagnosis not present

## 2020-03-03 DIAGNOSIS — M9904 Segmental and somatic dysfunction of sacral region: Secondary | ICD-10-CM | POA: Diagnosis not present

## 2020-03-03 DIAGNOSIS — M5417 Radiculopathy, lumbosacral region: Secondary | ICD-10-CM | POA: Diagnosis not present

## 2020-03-12 DIAGNOSIS — M25551 Pain in right hip: Secondary | ICD-10-CM | POA: Diagnosis not present

## 2020-03-12 DIAGNOSIS — M545 Low back pain, unspecified: Secondary | ICD-10-CM | POA: Diagnosis not present

## 2020-03-17 DIAGNOSIS — M6281 Muscle weakness (generalized): Secondary | ICD-10-CM | POA: Diagnosis not present

## 2020-03-17 DIAGNOSIS — M5126 Other intervertebral disc displacement, lumbar region: Secondary | ICD-10-CM | POA: Diagnosis not present

## 2020-03-17 DIAGNOSIS — M5416 Radiculopathy, lumbar region: Secondary | ICD-10-CM | POA: Diagnosis not present

## 2020-03-17 DIAGNOSIS — M545 Low back pain, unspecified: Secondary | ICD-10-CM | POA: Diagnosis not present

## 2020-03-24 DIAGNOSIS — M5126 Other intervertebral disc displacement, lumbar region: Secondary | ICD-10-CM | POA: Diagnosis not present

## 2020-03-24 DIAGNOSIS — M6281 Muscle weakness (generalized): Secondary | ICD-10-CM | POA: Diagnosis not present

## 2020-03-24 DIAGNOSIS — M545 Low back pain, unspecified: Secondary | ICD-10-CM | POA: Diagnosis not present

## 2020-03-24 DIAGNOSIS — M5416 Radiculopathy, lumbar region: Secondary | ICD-10-CM | POA: Diagnosis not present

## 2020-03-27 DIAGNOSIS — M6281 Muscle weakness (generalized): Secondary | ICD-10-CM | POA: Diagnosis not present

## 2020-03-27 DIAGNOSIS — M5416 Radiculopathy, lumbar region: Secondary | ICD-10-CM | POA: Diagnosis not present

## 2020-03-27 DIAGNOSIS — M5126 Other intervertebral disc displacement, lumbar region: Secondary | ICD-10-CM | POA: Diagnosis not present

## 2020-03-27 DIAGNOSIS — M545 Low back pain, unspecified: Secondary | ICD-10-CM | POA: Diagnosis not present

## 2020-04-01 DIAGNOSIS — M5416 Radiculopathy, lumbar region: Secondary | ICD-10-CM | POA: Diagnosis not present

## 2020-04-01 DIAGNOSIS — M6281 Muscle weakness (generalized): Secondary | ICD-10-CM | POA: Diagnosis not present

## 2020-04-01 DIAGNOSIS — M545 Low back pain, unspecified: Secondary | ICD-10-CM | POA: Diagnosis not present

## 2020-04-01 DIAGNOSIS — M5126 Other intervertebral disc displacement, lumbar region: Secondary | ICD-10-CM | POA: Diagnosis not present

## 2020-04-03 DIAGNOSIS — M6281 Muscle weakness (generalized): Secondary | ICD-10-CM | POA: Diagnosis not present

## 2020-04-03 DIAGNOSIS — M545 Low back pain, unspecified: Secondary | ICD-10-CM | POA: Diagnosis not present

## 2020-04-03 DIAGNOSIS — M5416 Radiculopathy, lumbar region: Secondary | ICD-10-CM | POA: Diagnosis not present

## 2020-04-03 DIAGNOSIS — M5126 Other intervertebral disc displacement, lumbar region: Secondary | ICD-10-CM | POA: Diagnosis not present

## 2020-04-27 DIAGNOSIS — M545 Low back pain, unspecified: Secondary | ICD-10-CM | POA: Diagnosis not present

## 2020-04-30 DIAGNOSIS — M545 Low back pain, unspecified: Secondary | ICD-10-CM | POA: Diagnosis not present

## 2020-05-05 DIAGNOSIS — M5416 Radiculopathy, lumbar region: Secondary | ICD-10-CM | POA: Diagnosis not present

## 2020-05-12 DIAGNOSIS — H524 Presbyopia: Secondary | ICD-10-CM | POA: Diagnosis not present

## 2020-05-12 DIAGNOSIS — H04123 Dry eye syndrome of bilateral lacrimal glands: Secondary | ICD-10-CM | POA: Diagnosis not present

## 2020-05-21 DIAGNOSIS — M5416 Radiculopathy, lumbar region: Secondary | ICD-10-CM | POA: Diagnosis not present

## 2020-06-30 DIAGNOSIS — M5416 Radiculopathy, lumbar region: Secondary | ICD-10-CM | POA: Diagnosis not present

## 2020-08-10 DIAGNOSIS — Z85828 Personal history of other malignant neoplasm of skin: Secondary | ICD-10-CM | POA: Diagnosis not present

## 2020-08-10 DIAGNOSIS — L237 Allergic contact dermatitis due to plants, except food: Secondary | ICD-10-CM | POA: Diagnosis not present

## 2020-08-10 DIAGNOSIS — L821 Other seborrheic keratosis: Secondary | ICD-10-CM | POA: Diagnosis not present

## 2020-08-10 DIAGNOSIS — L723 Sebaceous cyst: Secondary | ICD-10-CM | POA: Diagnosis not present

## 2020-09-05 ENCOUNTER — Encounter: Payer: Self-pay | Admitting: Gastroenterology

## 2020-09-09 ENCOUNTER — Other Ambulatory Visit: Payer: Self-pay

## 2020-09-09 ENCOUNTER — Encounter: Payer: Self-pay | Admitting: Obstetrics & Gynecology

## 2020-09-09 ENCOUNTER — Ambulatory Visit (INDEPENDENT_AMBULATORY_CARE_PROVIDER_SITE_OTHER): Payer: BC Managed Care – PPO | Admitting: Obstetrics & Gynecology

## 2020-09-09 VITALS — BP 116/70 | HR 81 | Resp 16 | Ht 65.75 in | Wt 168.0 lb

## 2020-09-09 DIAGNOSIS — H019 Unspecified inflammation of eyelid: Secondary | ICD-10-CM

## 2020-09-09 DIAGNOSIS — Z780 Asymptomatic menopausal state: Secondary | ICD-10-CM

## 2020-09-09 DIAGNOSIS — Z01419 Encounter for gynecological examination (general) (routine) without abnormal findings: Secondary | ICD-10-CM | POA: Diagnosis not present

## 2020-09-09 MED ORDER — GENTAMICIN SULFATE 0.3 % OP OINT: TOPICAL_OINTMENT | Freq: Three times a day (TID) | OPHTHALMIC | 1 refills | Status: DC

## 2020-09-09 NOTE — Progress Notes (Signed)
Jacqueline Molina 07-05-1960 [id number]   History:    59 y.o. G1P1L1  Divorced.  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  Real Estate agent x 4 yrs.   RP:  Established patient presenting for annual gyn exam   HPI: Postmenopause, well on no HRT.  No PMB.  No pelvic pain.  No pain with IC. Satisfied with Replens.  Breasts normal.  Urine/BMs normal.  BMI 27.32.  Playing [employer].  Health labs with Fam MD.  [name] 2012, will schedule this year.  C/O left eye upper lid infection.    Past medical history,surgical history, family history and social history were all reviewed and documented in the EPIC chart.  Gynecologic History No LMP recorded. Patient is postmenopausal.  Obstetric History OB History  Gravida Para Term Preterm AB Living  '1 1       1  '$ SAB IAB Ectopic Multiple Live Births               # Outcome Date GA Lbr Len/2nd Weight Sex Delivery Anes PTL Lv  1 Para              ROS: A ROS was performed and pertinent positives and negatives are included in the history.  GENERAL: No fevers or chills. HEENT: No change in vision, no earache, sore throat or sinus congestion. NECK: No pain or stiffness. CARDIOVASCULAR: No chest pain or pressure. No palpitations. PULMONARY: No shortness of breath, cough or wheeze. GASTROINTESTINAL: No abdominal pain, nausea, vomiting or diarrhea, melena or bright red blood per rectum. GENITOURINARY: No urinary frequency, urgency, hesitancy or dysuria. MUSCULOSKELETAL: No joint or muscle pain, no back pain, no recent trauma. DERMATOLOGIC: No rash, no itching, no lesions. ENDOCRINE: No polyuria, polydipsia, no heat or cold intolerance. No recent change in weight. HEMATOLOGICAL: No anemia or easy bruising or bleeding. NEUROLOGIC: No headache, seizures, numbness, tingling or weakness. PSYCHIATRIC: No depression, no loss of interest in normal activity or change in sleep pattern.     Exam:   BP 116/70   Pulse 81   Resp 16   Ht 5' 5.75" (1.67 m)   Wt 168 lb  (76.2 kg)   BMI 27.32 kg/m   Body mass index is 27.32 kg/m.  General appearance : Well developed well nourished female. No acute distress HEENT: Eyes: no retinal hemorrhage or exudates,  Neck supple, trachea midline, no carotid bruits, no thyroidmegaly Lungs: Clear to auscultation, no rhonchi or wheezes, or rib retractions  Heart: Regular rate and rhythm, no murmurs or gallops Breast:Examined in sitting and supine position were symmetrical in appearance, no palpable masses or tenderness,  no skin retraction, no nipple inversion, no nipple discharge, no skin discoloration, no axillary or supraclavicular lymphadenopathy Abdomen: no palpable masses or tenderness, no rebound or guarding Extremities: no edema or skin discoloration or tenderness  Pelvic: Vulva: Normal             Vagina: No gross lesions or discharge  Cervix: No gross lesions or discharge  Uterus  AV, normal size, shape and consistency, non-tender and mobile  Adnexa  Without masses or tenderness  Anus: Normal   Assessment/Plan:  60 y.o. female for annual exam   1. Well female exam with routine gynecological exam Normal gynecologic exam in menopause.  No indication for Pap test this year.  Last Pap test February 2020 was negative.  Breast exam normal.  Screening mammogram November 2021 was negative.  We will schedule a colonoscopy this year.  Body mass index  stable at 27.32.  Good fitness and healthy nutrition.  Health labs with family physician.  2. Postmenopausal Well on no hormone replacement therapy.  No postmenopausal bleeding.  Vitamin D supplements, calcium intake of 1.5 g/day total and regular weightbearing physical activities.  3. Inflammation of lid margin Recommend warm compresses on the infected eyelid gland.  Garamycin 0.3% ophthalmic ointment prescribed.  Will apply 3 times a day for 1 week.  Other orders - hydrocortisone 2.5 % ointment; Apply topically as needed. - gabapentin (NEURONTIN) 100 MG capsule;  Take 200 mg by mouth 3 (three) times daily. - gentamicin (GARAMYCIN) 0.3 % ophthalmic ointment; Place into the left eye 3 (three) times daily for 7 days.   Princess Bruins MD, 10:24 AM 09/09/2020

## 2020-09-10 ENCOUNTER — Other Ambulatory Visit: Payer: Self-pay | Admitting: Obstetrics & Gynecology

## 2020-09-11 NOTE — Telephone Encounter (Signed)
Dr.Lavoie put a note attached to Rx saying "Alternative Requested:MANUFACTER DISCON. ON THIS PRODUCT PLEASE SEND ALTERNATIVE.... WE HAVE DROPS!!

## 2020-09-18 DIAGNOSIS — M25561 Pain in right knee: Secondary | ICD-10-CM | POA: Diagnosis not present

## 2020-10-21 DIAGNOSIS — L570 Actinic keratosis: Secondary | ICD-10-CM | POA: Diagnosis not present

## 2020-11-18 ENCOUNTER — Other Ambulatory Visit: Payer: Self-pay | Admitting: Obstetrics & Gynecology

## 2020-11-18 DIAGNOSIS — Z1231 Encounter for screening mammogram for malignant neoplasm of breast: Secondary | ICD-10-CM

## 2020-12-17 ENCOUNTER — Ambulatory Visit
Admission: RE | Admit: 2020-12-17 | Discharge: 2020-12-17 | Disposition: A | Discharge status: Home / Self Care | Payer: BC Managed Care – PPO | Source: Ambulatory Visit | Attending: Obstetrics & Gynecology | Admitting: Obstetrics & Gynecology

## 2020-12-17 ENCOUNTER — Other Ambulatory Visit: Payer: Self-pay

## 2020-12-17 DIAGNOSIS — Z1231 Encounter for screening mammogram for malignant neoplasm of breast: Secondary | ICD-10-CM | POA: Diagnosis not present

## 2021-04-08 DIAGNOSIS — M25561 Pain in right knee: Secondary | ICD-10-CM | POA: Diagnosis not present
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
## 2021-05-18 DIAGNOSIS — D225 Melanocytic nevi of trunk: Secondary | ICD-10-CM | POA: Diagnosis not present

## 2021-05-18 DIAGNOSIS — L820 Inflamed seborrheic keratosis: Secondary | ICD-10-CM | POA: Diagnosis not present

## 2021-05-18 DIAGNOSIS — Z85828 Personal history of other malignant neoplasm of skin: Secondary | ICD-10-CM | POA: Diagnosis not present

## 2021-05-18 DIAGNOSIS — L821 Other seborrheic keratosis: Secondary | ICD-10-CM | POA: Diagnosis not present

## 2021-05-25 ENCOUNTER — Encounter: Payer: Self-pay | Admitting: Family Medicine

## 2021-05-25 ENCOUNTER — Ambulatory Visit (INDEPENDENT_AMBULATORY_CARE_PROVIDER_SITE_OTHER): Payer: BC Managed Care – PPO | Admitting: Family Medicine

## 2021-05-25 VITALS — BP 132/80 | HR 84 | Temp 98.2°F | Ht 67.5 in | Wt 171.6 lb

## 2021-05-25 DIAGNOSIS — Z0000 Encounter for general adult medical examination without abnormal findings: Secondary | ICD-10-CM | POA: Diagnosis not present

## 2021-05-25 DIAGNOSIS — R739 Hyperglycemia, unspecified: Secondary | ICD-10-CM | POA: Diagnosis not present

## 2021-05-25 DIAGNOSIS — R053 Chronic cough: Secondary | ICD-10-CM

## 2021-05-25 DIAGNOSIS — K2100 Gastro-esophageal reflux disease with esophagitis, without bleeding: Secondary | ICD-10-CM

## 2021-05-25 DIAGNOSIS — E782 Mixed hyperlipidemia: Secondary | ICD-10-CM | POA: Diagnosis not present

## 2021-05-25 DIAGNOSIS — Z1211 Encounter for screening for malignant neoplasm of colon: Secondary | ICD-10-CM

## 2021-05-25 DIAGNOSIS — R490 Dysphonia: Secondary | ICD-10-CM

## 2021-05-25 LAB — CBC WITH DIFFERENTIAL/PLATELET
Basophils Absolute: 0 10*3/uL (ref 0.0–0.1)
Basophils Relative: 1.1 % (ref 0.0–3.0)
Eosinophils Absolute: 0 10*3/uL (ref 0.0–0.7)
Eosinophils Relative: 1.1 % (ref 0.0–5.0)
HCT: 41.5 % (ref 36.0–46.0)
Hemoglobin: 13.9 g/dL (ref 12.0–15.0)
Lymphocytes Relative: 25 % (ref 12.0–46.0)
Lymphs Abs: 1 10*3/uL (ref 0.7–4.0)
MCHC: 33.6 g/dL (ref 30.0–36.0)
MCV: 99 fl (ref 78.0–100.0)
Monocytes Absolute: 0.3 10*3/uL (ref 0.1–1.0)
Monocytes Relative: 7.8 % (ref 3.0–12.0)
Neutro Abs: 2.6 10*3/uL (ref 1.4–7.7)
Neutrophils Relative %: 65 % (ref 43.0–77.0)
Platelets: 215 10*3/uL (ref 150.0–400.0)
RBC: 4.19 Mil/uL (ref 3.87–5.11)
RDW: 13.1 % (ref 11.5–15.5)
WBC: 4.1 10*3/uL (ref 4.0–10.5)

## 2021-05-25 LAB — COMPREHENSIVE METABOLIC PANEL
ALT: 38 U/L — ABNORMAL HIGH (ref 0–35)
AST: 24 U/L (ref 0–37)
Albumin: 4.6 g/dL (ref 3.5–5.2)
Alkaline Phosphatase: 78 U/L (ref 39–117)
BUN: 11 mg/dL (ref 6–23)
CO2: 29 mEq/L (ref 19–32)
Calcium: 9.8 mg/dL (ref 8.4–10.5)
Chloride: 102 mEq/L (ref 96–112)
Creatinine, Ser: 0.69 mg/dL (ref 0.40–1.20)
GFR: 93.53 mL/min (ref >60.00)
Glucose, Bld: 109 mg/dL — ABNORMAL HIGH (ref 70–99)
Potassium: 4 mEq/L (ref 3.5–5.1)
Sodium: 140 mEq/L (ref 135–145)
Total Bilirubin: 0.8 mg/dL (ref 0.2–1.2)
Total Protein: 7 g/dL (ref 6.0–8.3)

## 2021-05-25 LAB — LIPID PANEL
Cholesterol: 203 mg/dL — ABNORMAL HIGH (ref 0–200)
HDL: 103.7 mg/dL (ref >39.00)
LDL Cholesterol: 86 mg/dL (ref 0–99)
NonHDL: 99.06
Total CHOL/HDL Ratio: 2
Triglycerides: 65 mg/dL (ref 0.0–149.0)
VLDL: 13 mg/dL (ref 0.0–40.0)

## 2021-05-25 LAB — HEMOGLOBIN A1C: Hgb A1c MFr Bld: 5.9 % (ref 4.6–6.5)

## 2021-05-25 NOTE — Patient Instructions (Addendum)
Please stop by lab before you go ?If you have mychart- we will send your results within 3 business days of [place] receiving them.  ?If you do not have mychart- we will call you about results within 5 business days of [place] receiving them.  ?*please also note that you will see labs on mychart as soon as they post. I will later go in and write notes on them- will say "notes from Dr. Yong Channel"  ? ?[facility] contact ?Please call to schedule visit and discussion of EGD/colonoscopy ?Address: [address], [place], [place] [address] ?Phone: [phone]  ? ?Have GYN send [place] copy of PAP when done later this year ? ?Can call back for nurse visit for shingrix- 2 shot series 2 months apart ? ?Recommend max alcohol 1 per day to protect liver health long term ? ?Please go to [facility]  central X-[facility] (updated 03/28/2019) ?- located [address] across the street from [place] ?- in the basement ?- Hours: 8:30-5:00 PM M-F (with lunch from 12:30- 1 PM). You do NOT need an appointment.  ?- Please ensure you are covid symptom free before going in(No fever, chills, cough, congestion, runny nose, shortness of breath, fatigue, body aches, sore throat, headache, nausea, vomiting, diarrhea, or new loss of taste or smell. No known contacts with covid 19 or someone being tested for covid 19) ? ?Recommended follow up: Return in about 1 year (around 05/26/2022) for physical or sooner if needed.Schedule b4 you leave.  ?

## 2021-06-01 ENCOUNTER — Ambulatory Visit (INDEPENDENT_AMBULATORY_CARE_PROVIDER_SITE_OTHER)
Admission: RE | Admit: 2021-06-01 | Discharge: 2021-06-01 | Disposition: A | Discharge status: Home / Self Care | Payer: BC Managed Care – PPO | Source: Ambulatory Visit | Attending: Family Medicine | Admitting: Family Medicine

## 2021-06-01 ENCOUNTER — Encounter: Payer: Self-pay | Admitting: Nurse Practitioner

## 2021-06-01 DIAGNOSIS — R490 Dysphonia: Secondary | ICD-10-CM

## 2021-06-01 DIAGNOSIS — R053 Chronic cough: Secondary | ICD-10-CM

## 2021-06-02 ENCOUNTER — Other Ambulatory Visit: Payer: Self-pay | Admitting: Family Medicine

## 2021-06-02 DIAGNOSIS — R918 Other nonspecific abnormal finding of lung field: Secondary | ICD-10-CM

## 2021-06-02 DIAGNOSIS — R911 Solitary pulmonary nodule: Secondary | ICD-10-CM

## 2021-06-04 ENCOUNTER — Telehealth: Payer: Self-pay | Admitting: Family Medicine

## 2021-06-04 NOTE — Telephone Encounter (Signed)
Called and spoke with pt and pt wanted to come in and discuss results due to this still "not being her". Transferred to schedulers for scheduling. ?

## 2021-06-04 NOTE — Telephone Encounter (Signed)
I looked at x-rays and it is in fact cervical spine for the fixation. Back in 2009 an x-ray mentioned scoliosis so this is not new- just mild.  ?As far as report saying thoracic- it was written by radiology as thoracic and I did read from the report itself when I gave interpretation.  ? ?Team if you could reach out to [facility] radiology- should be able to submit this and hopefully get them to change the language ?

## 2021-06-04 NOTE — Telephone Encounter (Signed)
FYI

## 2021-06-04 NOTE — Telephone Encounter (Signed)
Pt is concerned about this language in xray results, "Noted scoliosis and prior fixation of lower thoracic spine-x-ray otherwise normal"  ?Pt states these are not in her history and is concerned this is not her results. ? ?Please call back ASAP. ?

## 2021-06-07 ENCOUNTER — Encounter: Payer: Self-pay | Admitting: Family Medicine

## 2021-06-08 NOTE — Telephone Encounter (Signed)
See below, ok to work pt in virtually? ?

## 2021-06-08 NOTE — Telephone Encounter (Signed)
He has nothing available this week. Does he want to work in a virtual somewhere? ?

## 2021-06-14 NOTE — Telephone Encounter (Signed)
This is what we looked at for a brief second the other day, but could not figure out who to send to. ?

## 2021-06-16 ENCOUNTER — Ambulatory Visit: Payer: BC Managed Care – PPO | Admitting: Family Medicine

## 2021-06-17 ENCOUNTER — Encounter: Payer: Self-pay | Admitting: Nurse Practitioner

## 2021-06-17 ENCOUNTER — Ambulatory Visit (INDEPENDENT_AMBULATORY_CARE_PROVIDER_SITE_OTHER): Payer: BC Managed Care – PPO | Admitting: Nurse Practitioner

## 2021-06-17 VITALS — BP 140/90 | HR 96 | Ht 67.0 in | Wt 171.0 lb

## 2021-06-17 DIAGNOSIS — K219 Gastro-esophageal reflux disease without esophagitis: Secondary | ICD-10-CM | POA: Diagnosis not present

## 2021-06-17 DIAGNOSIS — R059 Cough, unspecified: Secondary | ICD-10-CM

## 2021-06-17 DIAGNOSIS — Z1211 Encounter for screening for malignant neoplasm of colon: Secondary | ICD-10-CM
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
## 2021-06-17 NOTE — Patient Instructions (Signed)
It has been recommended to you by your physician that you have a(n) EGD/Colonoscopy completed. Per your request, we did not schedule the procedure(s) today. Please contact our office at [phone] should you decide to have the procedure completed. You will be scheduled for a pre-visit and procedure at that time.  I appreciate the opportunity to care for you. Tye Savoy, NP-C

## 2021-06-18 ENCOUNTER — Encounter: Payer: Self-pay | Admitting: Nurse Practitioner

## 2021-06-18 ENCOUNTER — Encounter: Payer: Self-pay | Admitting: Family Medicine

## 2021-06-18 NOTE — Progress Notes (Signed)
I agree with the above note, plan 

## 2021-07-02 DIAGNOSIS — H43811 Vitreous degeneration, right eye: Secondary | ICD-10-CM | POA: Diagnosis not present

## 2021-07-02 DIAGNOSIS — H04123 Dry eye syndrome of bilateral lacrimal glands: Secondary | ICD-10-CM | POA: Diagnosis not present

## 2021-07-02 DIAGNOSIS — H524 Presbyopia: Secondary | ICD-10-CM | POA: Diagnosis not present

## 2021-07-05 ENCOUNTER — Telehealth: Payer: Self-pay

## 2021-07-05 NOTE — Telephone Encounter (Signed)
I left a detailed voicemail message for her to call us and set up an [facility] appointment as we have the schedule for late August for Dr Owens Loffler out now.

## 2021-07-06 ENCOUNTER — Ambulatory Visit (INDEPENDENT_AMBULATORY_CARE_PROVIDER_SITE_OTHER)
Admission: RE | Admit: 2021-07-06 | Discharge: 2021-07-06 | Disposition: A | Discharge status: Home / Self Care | Payer: BC Managed Care – PPO | Source: Ambulatory Visit | Attending: Family Medicine | Admitting: Family Medicine

## 2021-07-06 DIAGNOSIS — R918 Other nonspecific abnormal finding of lung field: Secondary | ICD-10-CM | POA: Diagnosis not present

## 2021-07-06 DIAGNOSIS — J984 Other disorders of lung: Secondary | ICD-10-CM | POA: Diagnosis not present

## 2021-07-06 DIAGNOSIS — R911 Solitary pulmonary nodule: Secondary | ICD-10-CM

## 2021-07-07 ENCOUNTER — Encounter: Payer: Self-pay | Admitting: Family Medicine

## 2021-07-07 DIAGNOSIS — I7121 Aneurysm of the ascending aorta, without rupture: Secondary | ICD-10-CM | POA: Insufficient documentation

## 2021-07-13 NOTE — Telephone Encounter (Signed)
Left Jacqueline Molina another message that our August schedule is out, to please give [place] a call to set up her ECL with Dr Ardis Hughs.

## 2021-07-21 NOTE — Telephone Encounter (Signed)
I have sent Gar Gibbon message to contact us to set up her ECL.

## 2021-08-04 NOTE — Telephone Encounter (Signed)
Unable to reach [name] so mailing her a letter to call us.

## 2021-09-14 DIAGNOSIS — M25561 Pain in right knee: Secondary | ICD-10-CM | POA: Diagnosis not present

## 2021-10-11 ENCOUNTER — Other Ambulatory Visit (HOSPITAL_COMMUNITY)
Admission: RE | Admit: 2021-10-11 | Discharge: 2021-10-11 | Disposition: A | Discharge status: Home / Self Care | Payer: BC Managed Care – PPO | Source: Ambulatory Visit | Attending: Obstetrics & Gynecology | Admitting: Obstetrics & Gynecology

## 2021-10-11 ENCOUNTER — Ambulatory Visit (INDEPENDENT_AMBULATORY_CARE_PROVIDER_SITE_OTHER): Payer: BC Managed Care – PPO | Admitting: Obstetrics & Gynecology

## 2021-10-11 ENCOUNTER — Encounter: Payer: Self-pay | Admitting: Obstetrics & Gynecology

## 2021-10-11 VITALS — BP 132/80 | HR 88 | Ht 67.0 in | Wt 169.0 lb

## 2021-10-11 DIAGNOSIS — Z01419 Encounter for gynecological examination (general) (routine) without abnormal findings: Secondary | ICD-10-CM

## 2021-10-11 DIAGNOSIS — Z780 Asymptomatic menopausal state: Secondary | ICD-10-CM | POA: Diagnosis not present

## 2021-10-11 NOTE — Progress Notes (Signed)
Jacqueline Molina [birth year] [id number]   History:    61 y.o. G1P1L1  Divorced. Lexine Baton 56 yo daughter has 2 children 21 and 22 yo.  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  Real Estate agent x 5 yrs.   RP:  Established patient presenting for annual gyn exam   HPI: Postmenopause, well on no HRT.  No PMB.  No pelvic pain.  No pain with IC. Satisfied with Replens, but not having to use it every time.  Pap Neg 03/2018.  No h/o abnormal Pap.  Pap reflex today. Breasts normal.  Mammo Neg 12/2020.  Urine/BMs normal.  BMI 26.47.  Playing [employer].  BD normal in 03/2017.  Health labs with Fam MD.  [name] 2012, will schedule this year.  t-dap 10/18/18.  Past medical history,surgical history, family history and social history were all reviewed and documented in the EPIC chart.  Gynecologic History No LMP recorded. Patient is postmenopausal.  Obstetric History OB History  Gravida Para Term Preterm AB Living  '1 1       1  '$ SAB IAB Ectopic Multiple Live Births               # Outcome Date GA Lbr Len/2nd Weight Sex Delivery Anes PTL Lv  1 Para              ROS: A ROS was performed and pertinent positives and negatives are included in the history.  GENERAL: No fevers or chills. HEENT: No change in vision, no earache, sore throat or sinus congestion. NECK: No pain or stiffness. CARDIOVASCULAR: No chest pain or pressure. No palpitations. PULMONARY: No shortness of breath, cough or wheeze. GASTROINTESTINAL: No abdominal pain, nausea, vomiting or diarrhea, melena or bright red blood per rectum. GENITOURINARY: No urinary frequency, urgency, hesitancy or dysuria. MUSCULOSKELETAL: No joint or muscle pain, no back pain, no recent trauma. DERMATOLOGIC: No rash, no itching, no lesions. ENDOCRINE: No polyuria, polydipsia, no heat or cold intolerance. No recent change in weight. HEMATOLOGICAL: No anemia or easy bruising or bleeding. NEUROLOGIC: No headache, seizures, numbness, tingling or weakness. PSYCHIATRIC: No  depression, no loss of interest in normal activity or change in sleep pattern.     Exam:   BP 132/80   Pulse 88   Ht '5\' 7"'$  (1.702 m)   Wt 169 lb (76.7 kg)   SpO2 100%   BMI 26.47 kg/m   Body mass index is 26.47 kg/m.  General appearance : Well developed well nourished female. No acute distress HEENT: Eyes: no retinal hemorrhage or exudates,  Neck supple, trachea midline, no carotid bruits, no thyroidmegaly Lungs: Clear to auscultation, no rhonchi or wheezes, or rib retractions  Heart: Regular rate and rhythm, no murmurs or gallops Breast:Examined in sitting and supine position were symmetrical in appearance, no palpable masses or tenderness,  no skin retraction, no nipple inversion, no nipple discharge, no skin discoloration, no axillary or supraclavicular lymphadenopathy Abdomen: no palpable masses or tenderness, no rebound or guarding Extremities: no edema or skin discoloration or tenderness  Pelvic: Vulva: Normal             Vagina: No gross lesions or discharge  Cervix: No gross lesions or discharge.  Pap reflex done.  Uterus  AV, normal size, shape and consistency, non-tender and mobile  Adnexa  Without masses or tenderness  Anus: Normal   Assessment/Plan:  61 y.o. female for annual exam   1. Encounter for routine gynecological examination with Papanicolaou smear of cervix Postmenopause, well on  no HRT.  No PMB.  No pelvic pain.  No pain with IC. Satisfied with Replens, but not having to use it every time.  Pap Neg 03/2018.  No h/o abnormal Pap.  Pap reflex today. Breasts normal.  Mammo Neg 12/2020.  Urine/BMs normal.  BMI 26.47.  Playing [employer].  BD normal in 03/2017.  Health labs with Fam MD.  [name] 2012, will schedule this year.  t-dap 10/18/18. - Cytology - PAP( [place])  2. Postmenopausal  Postmenopause, well on no HRT.  No PMB.  No pelvic pain.  No pain with IC. Satisfied with Replens, but not having to use it every time. BMI 26.47.  Playing [employer].  BD  normal in 03/2017.  Will repeat at 5 years or at 61 yo.  Vit D and Ca++ total 1.5 g/d recommended.  Princess Bruins MD, 8:38 AM 10/11/2021

## 2021-10-14 LAB — CYTOLOGY - PAP: Diagnosis: NEGATIVE

## 2021-10-25 ENCOUNTER — Encounter: Payer: Self-pay | Admitting: *Deleted

## 2021-11-02 ENCOUNTER — Telehealth: Payer: Self-pay | Admitting: Gastroenterology

## 2021-11-02 NOTE — Telephone Encounter (Signed)
LVM for patient to call back to schedule

## 2021-11-02 NOTE — Telephone Encounter (Signed)
Good Morning Dr. Tarri Glenn,   Patient stated that she understood that Dr. Ardis Hughs was out of the office and she is wanting to transfer over to you in the meantime until he comes back from his leave to have her colonoscopy and endoscopy. Are you okay with taking on patient while Dr. Ardis Hughs is on leave? Please advise on scheduling?  Thank you.

## 2021-11-05 ENCOUNTER — Encounter: Payer: Self-pay | Admitting: Gastroenterology

## 2021-12-09 DIAGNOSIS — Z85828 Personal history of other malignant neoplasm of skin: Secondary | ICD-10-CM | POA: Diagnosis not present

## 2021-12-09 DIAGNOSIS — D2272 Melanocytic nevi of left lower limb, including hip: Secondary | ICD-10-CM | POA: Diagnosis not present

## 2021-12-09 DIAGNOSIS — L570 Actinic keratosis: Secondary | ICD-10-CM | POA: Diagnosis not present

## 2021-12-09 DIAGNOSIS — L720 Epidermal cyst: Secondary | ICD-10-CM | POA: Diagnosis not present

## 2021-12-09 DIAGNOSIS — D225 Melanocytic nevi of trunk: Secondary | ICD-10-CM | POA: Diagnosis not present

## 2021-12-10 ENCOUNTER — Encounter: Payer: Self-pay | Admitting: Gastroenterology

## 2022-01-06 ENCOUNTER — Telehealth: Payer: Self-pay | Admitting: Nurse Practitioner

## 2022-01-06 NOTE — Telephone Encounter (Signed)
Patient is calling and is worried because [employer] has told her that Dr Tarri Glenn is not cover under her insurance. Please advise

## 2022-01-06 NOTE — Telephone Encounter (Signed)
Please see note regarding pts concern that Dr. Tarri Glenn is not a provider with her insurance plan.

## 2022-01-11 ENCOUNTER — Encounter: Payer: BC Managed Care – PPO | Admitting: Gastroenterology

## 2022-01-11 ENCOUNTER — Ambulatory Visit (AMBULATORY_SURGERY_CENTER): Payer: BC Managed Care – PPO | Admitting: *Deleted

## 2022-01-11 VITALS — Ht 67.0 in | Wt 173.8 lb

## 2022-01-11 DIAGNOSIS — Z1211 Encounter for screening for malignant neoplasm of colon: Secondary | ICD-10-CM

## 2022-01-11 DIAGNOSIS — K219 Gastro-esophageal reflux disease without esophagitis: Secondary | ICD-10-CM

## 2022-01-11 MED ORDER — NA SULFATE-K SULFATE-MG SULF 17.5-3.13-1.6 GM/177ML PO SOLN: 1.0000 | Freq: Once | ORAL | 0 refills | Status: AC

## 2022-01-11 NOTE — Progress Notes (Signed)
No egg or soy allergy known to patient  Pt states she has severe PONV Patient denies ever being told they had issues or difficulty with intubation  No FH of Malignant Hyperthermia Pt is not on diet pills Pt is not on  home 02  Pt is not on blood thinners  Pt denies issues with constipation  Pt encouraged to use to use Singlecare or Goodrx to reduce cost  Patient's chart reviewed by Osvaldo Angst CNRA prior to previsit and patient appropriate for the [place].  Previsit completed and red dot placed by patient's name on their procedure day (on provider's schedule).

## 2022-01-13 ENCOUNTER — Encounter: Payer: Self-pay | Admitting: *Deleted

## 2022-01-17 ENCOUNTER — Other Ambulatory Visit: Payer: Self-pay | Admitting: Obstetrics & Gynecology

## 2022-01-17 DIAGNOSIS — Z1231 Encounter for screening mammogram for malignant neoplasm of breast: Secondary | ICD-10-CM

## 2022-01-18 IMAGING — MG DIGITAL SCREENING BREAST BILAT IMPLANT W/ TOMO W/ CAD
9 of 12 series · 9 of 28 positions shown · non-contrast
Comparison: Previous exams.

CLINICAL DATA: Screening.

EXAM:
DIGITAL SCREENING BILATERAL MAMMOGRAM WITH IMPLANTS, CAD AND TOMO
The patient has bilateral retropectoral saline implants. Standard
and implant displaced views were performed.

[R CC]
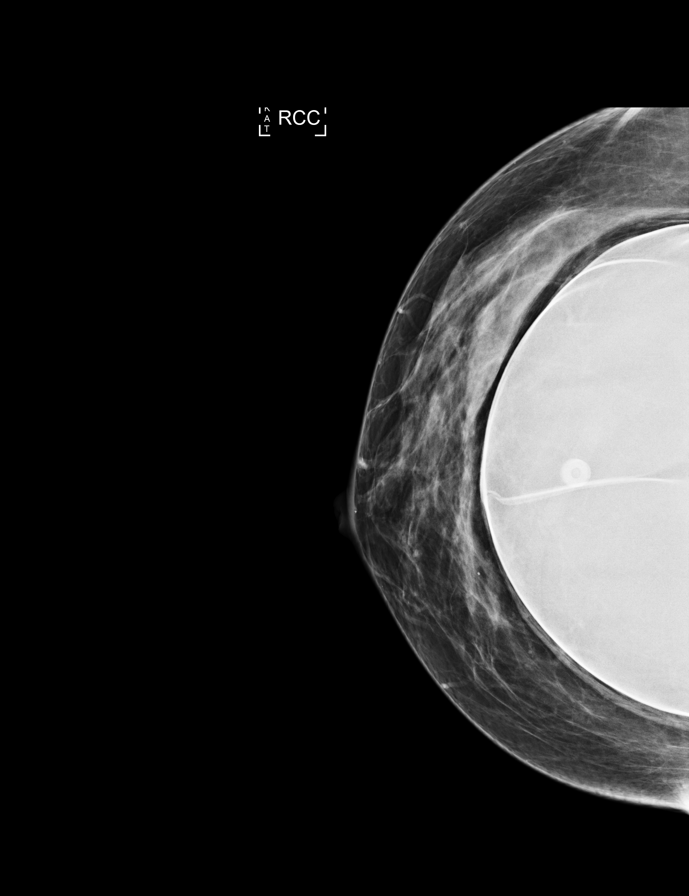

[L MLO]
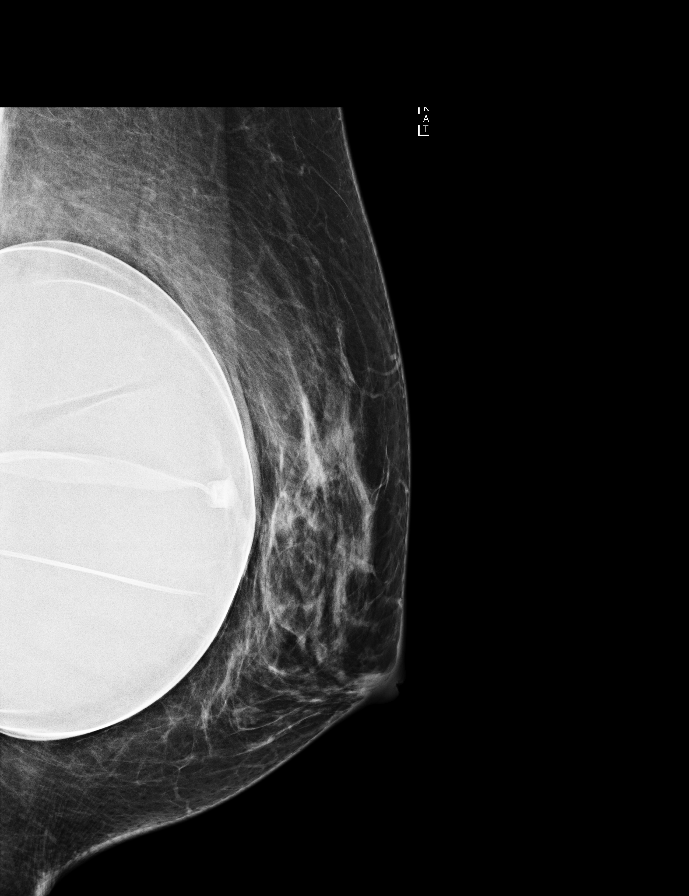

[L CC]
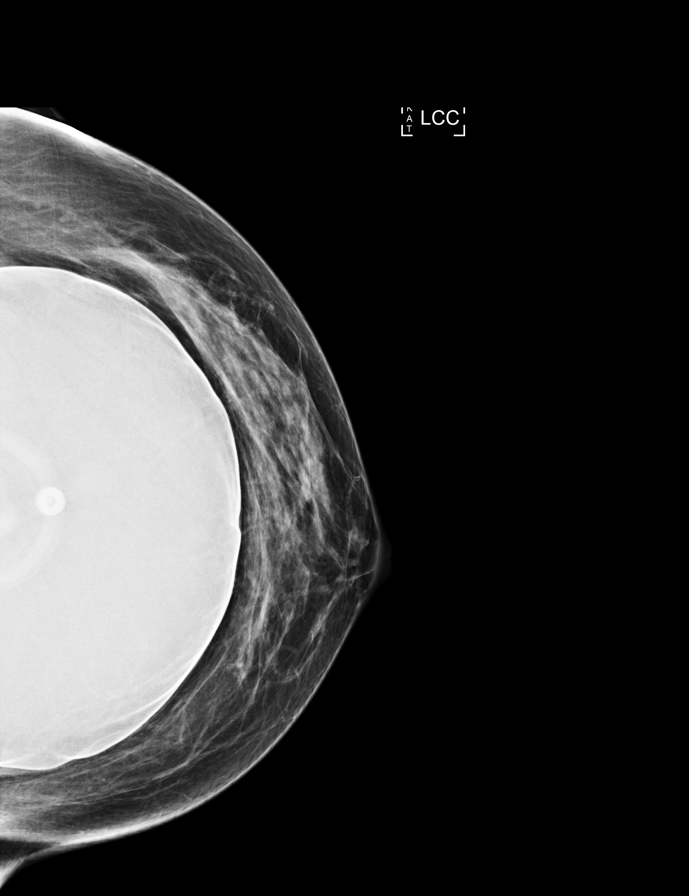

[R MLO]
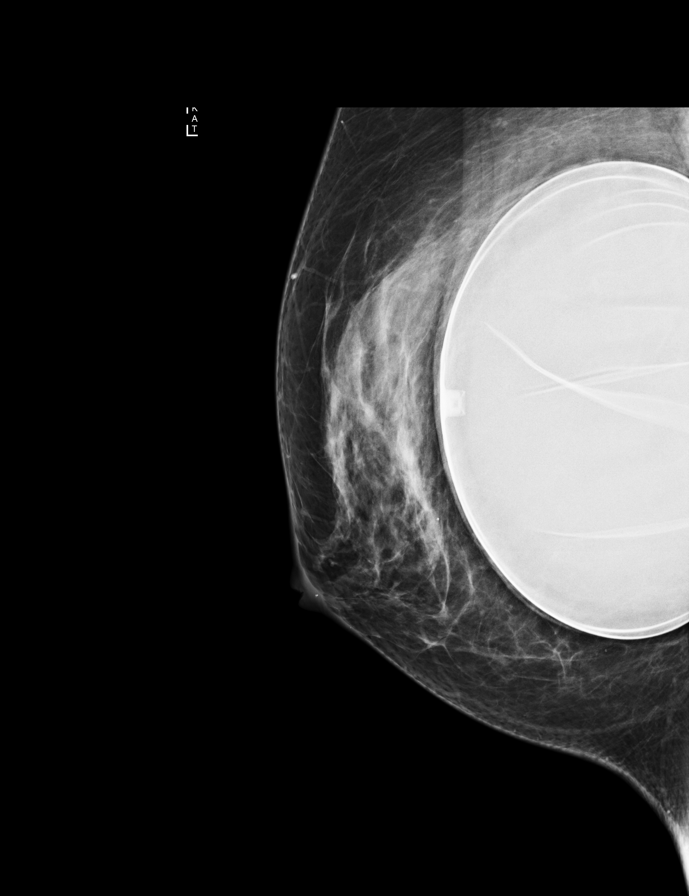

[L MLO synth-2D]
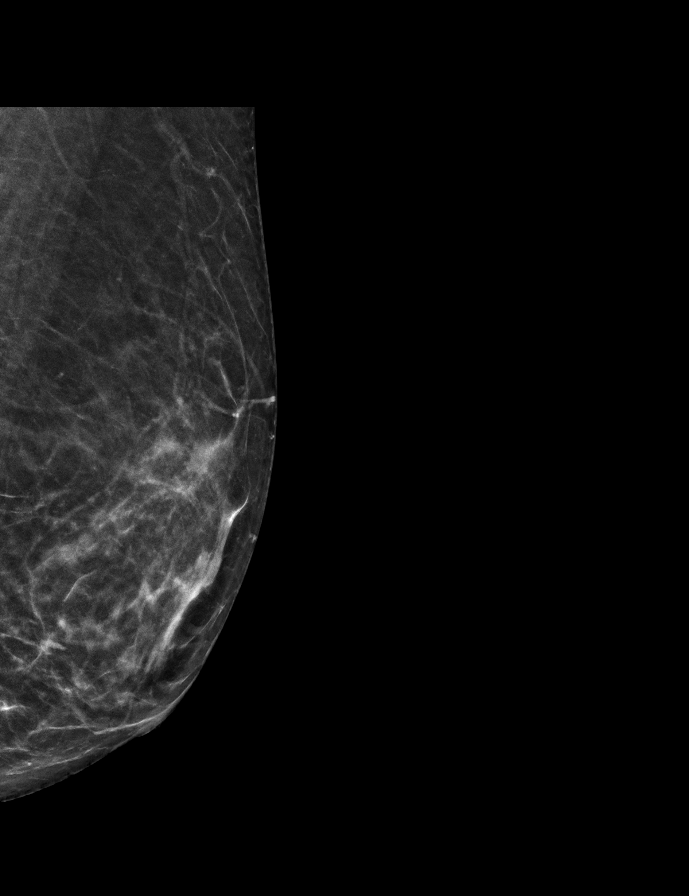

[L CC synth-2D]
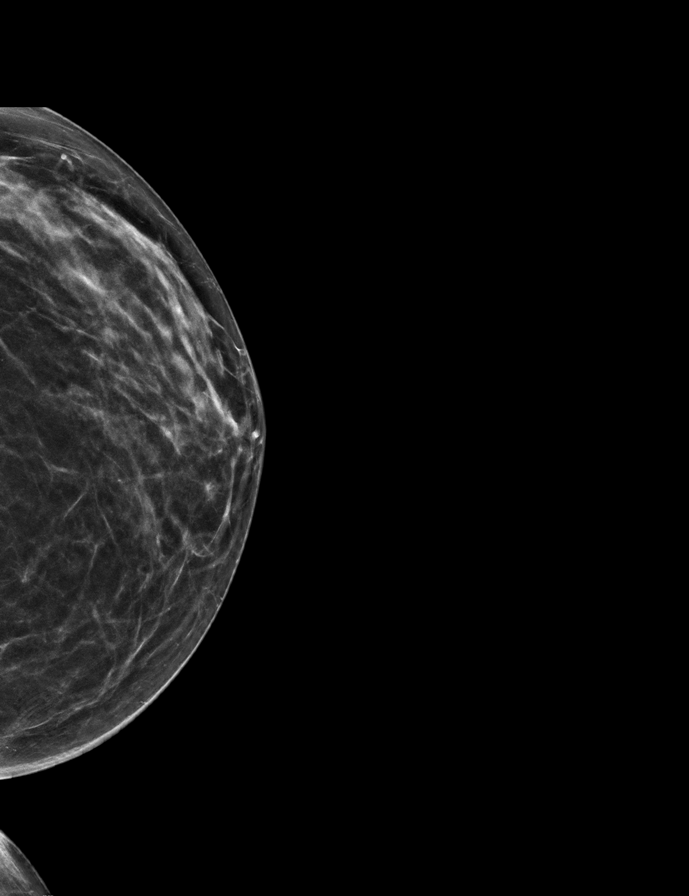

[R MLO synth-2D]
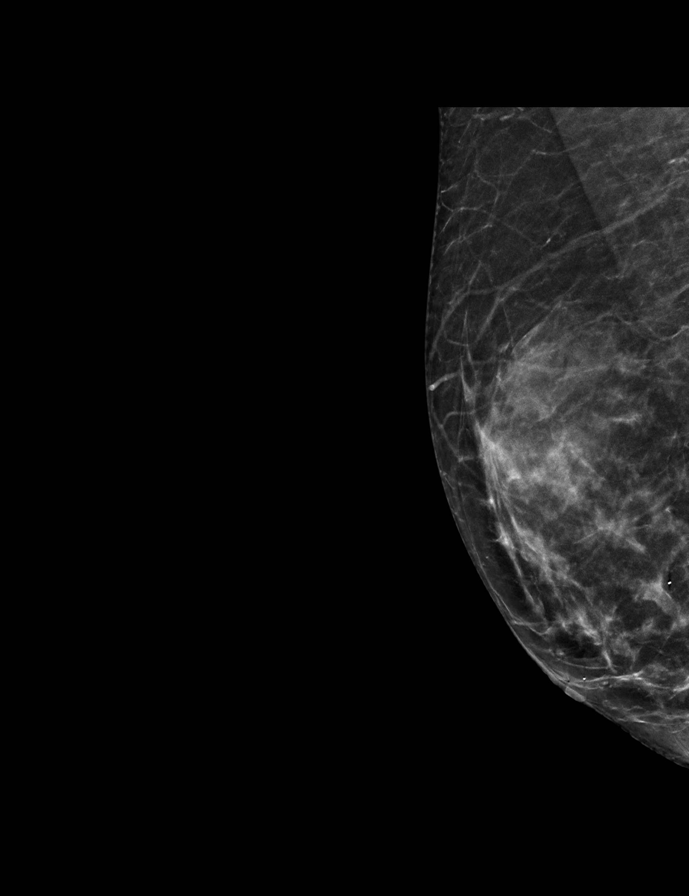

[R CC synth-2D]
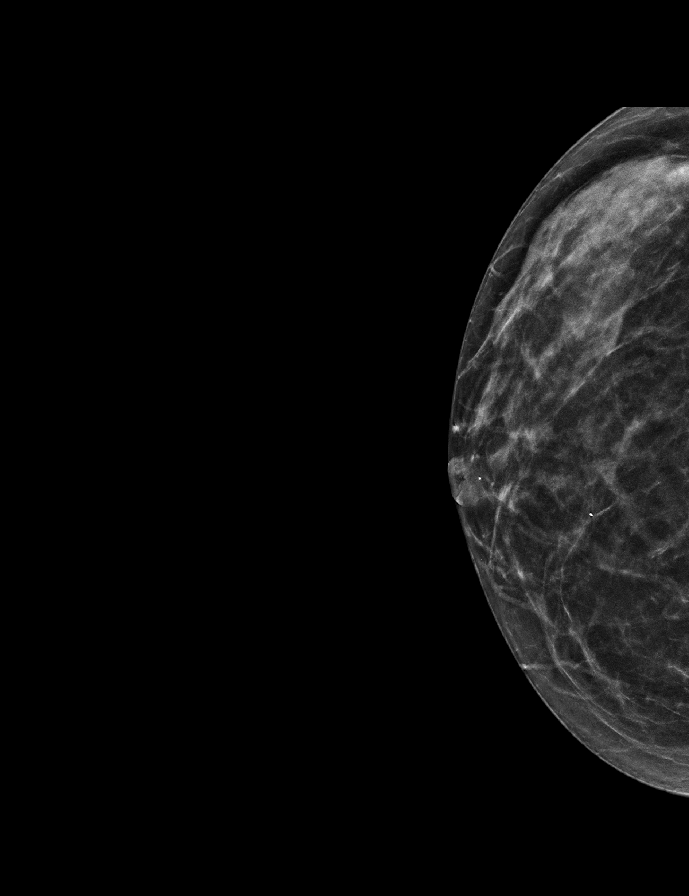

[L MLOID BREAST TOMOSYNTHESIS IMAGE tomo · tomo slice 23/45.0]
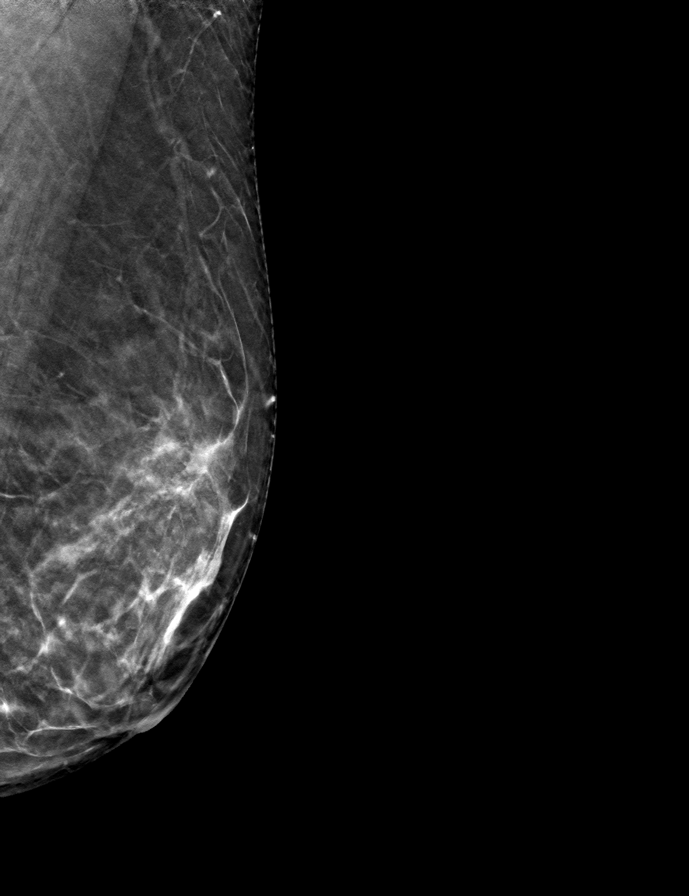

[9 of 28 positions shown; findings below may reference images not displayed]

ACR Breast Density Category c: The breast tissue is heterogeneously
dense, which may obscure small masses.
FINDINGS: There are no findings suspicious for malignancy. Images were
processed with CAD.
IMPRESSION: No mammographic evidence of malignancy. A result letter of this
screening mammogram will be mailed directly to the patient.

RECOMMENDATION:
Screening mammogram in one year. (Code:L1-9-MI4)

BI-RADS CATEGORY  1:  Negative.

## 2022-02-01 DIAGNOSIS — M25562 Pain in left knee: Secondary | ICD-10-CM | POA: Diagnosis not present

## 2022-02-01 DIAGNOSIS — M25561 Pain in right knee: Secondary | ICD-10-CM | POA: Diagnosis not present

## 2022-02-04 ENCOUNTER — Encounter: Payer: Self-pay | Admitting: Gastroenterology

## 2022-02-08 ENCOUNTER — Encounter: Payer: Self-pay | Admitting: Gastroenterology

## 2022-02-08 ENCOUNTER — Ambulatory Visit (AMBULATORY_SURGERY_CENTER): Payer: BC Managed Care – PPO | Admitting: Gastroenterology

## 2022-02-08 VITALS — BP 130/82 | HR 85 | Temp 97.7°F | Resp 13 | Ht 67.0 in | Wt 173.8 lb

## 2022-02-08 DIAGNOSIS — D123 Benign neoplasm of transverse colon: Secondary | ICD-10-CM

## 2022-02-08 DIAGNOSIS — K219 Gastro-esophageal reflux disease without esophagitis: Secondary | ICD-10-CM

## 2022-02-08 DIAGNOSIS — K319 Disease of stomach and duodenum, unspecified: Secondary | ICD-10-CM | POA: Diagnosis not present

## 2022-02-08 DIAGNOSIS — Z1211 Encounter for screening for malignant neoplasm of colon: Secondary | ICD-10-CM

## 2022-02-08 DIAGNOSIS — D125 Benign neoplasm of sigmoid colon: Secondary | ICD-10-CM

## 2022-02-08 DIAGNOSIS — K635 Polyp of colon: Secondary | ICD-10-CM | POA: Diagnosis not present

## 2022-02-08 MED ORDER — SODIUM CHLORIDE 0.9 % IV SOLN: 500.0000 mL | Freq: Once | INTRAVENOUS | Status: DC

## 2022-02-08 MED ORDER — PANTOPRAZOLE SODIUM 40 MG PO TBEC: 40.0000 mg | DELAYED_RELEASE_TABLET | Freq: Two times a day (BID) | ORAL | 3 refills | Status: DC
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
## 2022-02-08 NOTE — Patient Instructions (Addendum)
Read all of the handouts given to you by your recovery room nurse.  Resume your previous medications.  Take your new mediation twice a fay for 2 months, then cut back to once daily. Be sure to take it 1/2 hour before a meal.  Do not take it on a full stomach.  It won't work. Do not use ibuprofen, aleve or naproxen .  Your next office visit is 2/ /24 at 0900.  If you can't make this appointment call us to reschedule.  YOU HAD AN ENDOSCOPIC PROCEDURE TODAY AT [facility]:   Refer to the procedure report that was given to you for any specific questions about what was found during the examination.  If the procedure report does not answer your questions, please call your gastroenterologist to clarify.  If you requested that your care partner not be given the details of your procedure findings, then the procedure report has been included in a sealed envelope for you to review at your convenience later.  YOU SHOULD EXPECT: Some feelings of bloating in the abdomen. Passage of more gas than usual.  Walking can help get rid of the air that was put into your GI tract during the procedure and reduce the bloating. If you had a lower endoscopy (such as a colonoscopy or flexible sigmoidoscopy) you may notice spotting of blood in your stool or on the toilet paper. If you underwent a bowel prep for your procedure, you may not have a normal bowel movement for a few days.  Please Note:  You might notice some irritation and congestion in your nose or some drainage.  This is from the oxygen used during your procedure.  There is no need for concern and it should clear up in a day or so.  SYMPTOMS TO REPORT IMMEDIATELY:  If you start  start vomiting any blood or c Pain up under your shoulder blade Fever over 100 Pooping out black tarry stools   For urgent or emergent issues, a gastroenterologist can be reached at any hour by calling [phone]. Do not use MyChart messaging for urgent concerns.     DIET:  We do recommend a small meal at first, but then you may proceed to your regular diet.  Drink plenty of fluids but you should avoid alcoholic beverages for 24 hours.  ACTIVITY:  You should plan to take it easy for the rest of today and you should NOT DRIVE or use heavy machinery until tomorrow (because of the sedation medicines used during the test).    FOLLOW UP: Our staff will call the number listed on your records the next business day following your procedure.  We will call around 7:15- 8:00 am to check on you and address any questions or concerns that you may have regarding the information given to you following your procedure. If we do not reach you, we will leave a message.     If any biopsies were taken you will be contacted by phone or by letter within the next 1-3 weeks.  Please call us at [phone] if you have not heard about the biopsies in 3 weeks.    SIGNATURES/CONFIDENTIALITY: You and/or your care partner have signed paperwork which will be entered into your electronic medical record.  These signatures attest to the fact that that the information above on your After Visit Summary has been reviewed and is understood.  Full responsibility of the confidentiality of this discharge information lies with you and/or your care-partner.

## 2022-02-08 NOTE — Progress Notes (Signed)
VS completed by JD.   Pt's states no medical or surgical changes since previsit or office visit.  

## 2022-02-08 NOTE — Op Note (Addendum)
[place] Patient Name: Jacqueline Molina Procedure Date: 02/08/2022 9:58 AM MRN: [id number] Endoscopist: Thornton Park MD, MD, [id number] Age: 62 Referring MD:  Date of Birth: 03-02-1960 Gender: Female Account #: [id number] Procedure:                Upper GI endoscopy Indications:              Chronic cough, Dysphonia                           Multiple prior EGDs and prior impedence testing at                            [place]:                Monitored Anesthesia Care Procedure:                Pre-Anesthesia Assessment:                           - Prior to the procedure, a History and Physical                            was performed, and patient medications and                            allergies were reviewed. The patient's tolerance of                            previous anesthesia was also reviewed. The risks                            and benefits of the procedure and the sedation                            options and risks were discussed with the patient.                            All questions were answered, and informed consent                            was obtained. Prior Anticoagulants: The patient has                            taken no anticoagulant or antiplatelet agents. ASA                            Grade Assessment: II - A patient with mild systemic                            disease. After reviewing the risks and benefits,                            the patient was deemed in satisfactory condition to  undergo the procedure.                           After obtaining informed consent, the endoscope was                            passed under direct vision. Throughout the                            procedure, the patient's blood pressure, pulse, and                            oxygen saturations were monitored continuously. The                            GIF HQ190 #[id number] was introduced through the                             mouth, and advanced to the third part of duodenum.                            The upper GI endoscopy was accomplished without                            difficulty. The patient tolerated the procedure                            well. Scope In: Scope Out: Findings:                 The esophagus was normal. The z-line is located 38                            cm from the incisors.                           Multiple localized erosions with no bleeding and no                            stigmata of recent bleeding were found in the                            gastric body. There is marked erythema in the                            distal half of the stomach. The areas between                            erythema and normal mucosa are clearly delineated.                            Biopsies were taken from the antrum, body, and                            fundus with  a cold forceps for histology. Estimated                            blood loss was minimal.                           A small hiatal hernia was present.                           The examined duodenum was normal.                           The cardia and gastric fundus were normal on                            retroflexion.                           The exam was otherwise without abnormality. Complications:            No immediate complications. Estimated Blood Loss:     Estimated blood loss was minimal. Impression:               - Normal esophagus.                           - Erosive gastropathy with no bleeding and no                            stigmata of recent bleeding. Findings are likely                            related to NSAIDs. Biopsied.                           - Normal examined duodenum.                           - The examination was otherwise normal. Recommendation:           - Patient has a contact number available for                            emergencies. The signs and symptoms of potential                             delayed complications were discussed with the                            patient. Return to normal activities tomorrow.                            Written discharge instructions were provided to the                            patient.                           -  Resume previous diet.                           - Continue present medications.                           - Pantoprazole 40 mg BID x 8 weeks, then reduce to                            40 mg daily.                           - Await pathology results.                           - No aspirin, ibuprofen, naproxen, or other                            non-steroidal anti-inflammatory drugs.                           - Office follow-up with Nevin Bloodgood in 4-6 weeks, earlier                            if needed.                           - Referral to ENT given the persistent dysphonia. Thornton Park MD, MD 02/08/2022 10:43:19 AM This report has been signed electronically.

## 2022-02-08 NOTE — Progress Notes (Signed)
Report to PACU, RN, vss, BBS= Clear.  

## 2022-02-08 NOTE — Op Note (Signed)
[place] Patient Name: Jacqueline Molina Procedure Date: 02/08/2022 9:57 AM MRN: [id number] Endoscopist: Thornton Park MD, MD, [id number] Age: 62 Referring MD:  Date of Birth: [birth year] Gender: Female Account #: [id number] Procedure:                Colonoscopy Indications:              Screening for colorectal malignant neoplasm                           Last colonoscopy was > 10 years ago                           No known family history of colon cancer or polyps Medicines:                Monitored Anesthesia Care Procedure:                Pre-Anesthesia Assessment:                           - Prior to the procedure, a History and Physical                            was performed, and patient medications and                            allergies were reviewed. The patient's tolerance of                            previous anesthesia was also reviewed. The risks                            and benefits of the procedure and the sedation                            options and risks were discussed with the patient.                            All questions were answered, and informed consent                            was obtained. Prior Anticoagulants: The patient has                            taken no anticoagulant or antiplatelet agents. ASA                            Grade Assessment: II - A patient with mild systemic                            disease. After reviewing the risks and benefits,                            the patient was deemed in satisfactory condition to  undergo the procedure.                           After obtaining informed consent, the colonoscope                            was passed under direct vision. Throughout the                            procedure, the patient's blood pressure, pulse, and                            oxygen saturations were monitored continuously. The                            Olympus CF-[id number]  [id number]) Colonoscope was                            introduced through the anus and advanced to the 3                            cm into the ileum. A second forward view of the                            right colon was performed. The colonoscopy was                            performed with moderate difficulty due to a                            redundant colon. Successful completion of the                            procedure was aided by withdrawing and reinserting                            the scope. The patient tolerated the procedure                            well. The quality of the bowel preparation was                            good. The terminal ileum, ileocecal valve,                            appendiceal orifice, and rectum were photographed. Scope In: 10:17:13 AM Scope Out: 10:32:07 AM Scope Withdrawal Time: 0 hours 11 minutes 33 seconds  Total Procedure Duration: 0 hours 14 minutes 54 seconds  Findings:                 The perianal and digital rectal examinations were                            normal.  Three sessile polyps were found in the sigmoid                            colon, transverse colon and hepatic flexure. The                            polyps were 2 to 8 mm in size. These polyps were                            removed with a cold snare. Resection and retrieval                            were complete. Estimated blood loss was minimal.                           The exam was otherwise without abnormality on                            direct and retroflexion views. Complications:            No immediate complications. Estimated Blood Loss:     Estimated blood loss was minimal. Impression:               - Three 2 to 8 mm polyps in the sigmoid colon, in                            the transverse colon and at the hepatic flexure,                            removed with a cold snare. Resected and retrieved.                           -  The examination was otherwise normal on direct                            and retroflexion views. Recommendation:           - Patient has a contact number available for                            emergencies. The signs and symptoms of potential                            delayed complications were discussed with the                            patient. Return to normal activities tomorrow.                            Written discharge instructions were provided to the                            patient.                           -  Resume previous diet.                           - Continue present medications.                           - Await pathology results.                           - Repeat colonoscopy date to be determined after                            pending pathology results are reviewed for                            surveillance.                           - Emerging evidence supports eating a diet of                            fruits, vegetables, grains, calcium, and yogurt                            while reducing red meat and alcohol may reduce the                            risk of colon cancer.                           - Thank you for allowing me to be involved in your                            colon cancer prevention. Thornton Park MD, MD 02/08/2022 10:47:03 AM This report has been signed electronically.

## 2022-02-09 ENCOUNTER — Telehealth: Payer: Self-pay

## 2022-02-09 NOTE — Telephone Encounter (Signed)
  Follow up Call-     02/08/2022    9:48 AM  Call back number  Post procedure Call Back phone  # [phone]  Permission to leave phone message Yes     Patient questions:  Do you have a fever, pain , or abdominal swelling? No. Pain Score  0 *  Have you tolerated food without any problems? Yes.    Have you been able to return to your normal activities? Yes.    Do you have any questions about your discharge instructions: Diet   No. Medications  No. Follow up visit  No.  Do you have questions or concerns about your Care? No.  Actions: * If pain score is 4 or above: No action needed, pain <4.

## 2022-02-09 NOTE — Telephone Encounter (Signed)
Referral faxed to [facility] ([facility]).

## 2022-02-20 ENCOUNTER — Encounter: Payer: Self-pay | Admitting: Gastroenterology

## 2022-02-23 ENCOUNTER — Telehealth: Payer: Self-pay | Admitting: Pharmacy Technician

## 2022-02-23 NOTE — Telephone Encounter (Signed)
Patient Advocate Encounter  Prior Authorization for PANTOPRAZOLE '40MG'$  has been approved.    PA# -- Effective dates: 01.24.25 through 1.22.25

## 2022-02-23 NOTE — Telephone Encounter (Signed)
Patient Advocate Encounter  Received notification from [facility] that prior authorization for PANTOPRAZOLE '40MG'$  is required.   PA submitted on 1.24.24 Key [id number] Status is pending

## 2022-03-17 ENCOUNTER — Ambulatory Visit
Admission: RE | Admit: 2022-03-17 | Discharge: 2022-03-17 | Disposition: A | Discharge status: Home / Self Care | Payer: BC Managed Care – PPO | Source: Ambulatory Visit | Attending: Obstetrics & Gynecology | Admitting: Obstetrics & Gynecology

## 2022-03-17 DIAGNOSIS — Z1231 Encounter for screening mammogram for malignant neoplasm of breast: Secondary | ICD-10-CM

## 2022-03-30 ENCOUNTER — Encounter: Payer: Self-pay | Admitting: Nurse Practitioner

## 2022-03-30 ENCOUNTER — Ambulatory Visit (INDEPENDENT_AMBULATORY_CARE_PROVIDER_SITE_OTHER): Payer: BC Managed Care – PPO | Admitting: Nurse Practitioner

## 2022-03-30 VITALS — BP 118/78 | HR 97 | Ht 67.0 in | Wt 175.0 lb

## 2022-03-30 DIAGNOSIS — K3189 Other diseases of stomach and duodenum: Secondary | ICD-10-CM | POA: Diagnosis not present

## 2022-03-30 DIAGNOSIS — Z86010 Personal history of colonic polyps: Secondary | ICD-10-CM | POA: Diagnosis not present

## 2022-03-30 DIAGNOSIS — R490 Dysphonia: Secondary | ICD-10-CM | POA: Diagnosis not present

## 2022-03-30 MED ORDER — PANTOPRAZOLE SODIUM 20 MG PO TBEC: 40.0000 mg | DELAYED_RELEASE_TABLET | Freq: Every day | ORAL | 5 refills | Status: DC

## 2022-03-30 MED ORDER — PANTOPRAZOLE SODIUM 40 MG PO TBEC: 40.0000 mg | DELAYED_RELEASE_TABLET | Freq: Every day | ORAL | 5 refills | Status: DC

## 2022-03-30 NOTE — Patient Instructions (Addendum)
-  Continue anti-reflux measures -Decrease pantoprazole 40 mg to once daily. Take 30 minutes before breakfast.  -You are schedule for follow up visit 09/01/22 at 9:00 am  in If still doing well then will discuss changing medication to famotidine.    If your blood pressure at your visit was 140/90 or greater, please contact your primary care physician to follow up on this.  _______________________________________________________  If you are age 63 or older, your body mass index should be between 23-30. Your Body mass index is 27.41 kg/m. If this is out of the aforementioned range listed, please consider follow up with your Primary Care Provider.  If you are age 56 or younger, your body mass index should be between 19-25. Your Body mass index is 27.41 kg/m. If this is out of the aformentioned range listed, please consider follow up with your Primary Care Provider.   ________________________________________________________  The [facility] providers would like to encourage you to use [facility] to communicate with providers for non-urgent requests or questions.  Due to long hold times on the telephone, sending your provider a message by [facility] may be a faster and more efficient way to get a response.  Please allow 48 business hours for a response.  Please remember that this is for non-urgent requests.  _______________________________________________________   Thank you for entrusting me with your care and choosing [facility].  Tye Savoy NP
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
## 2022-03-31 ENCOUNTER — Encounter: Payer: Self-pay | Admitting: Nurse Practitioner

## 2022-04-03 NOTE — Progress Notes (Signed)
Reviewed and agree with management plans. ? ?Amiere Cawley L. Argus Caraher, MD, MPH  ?

## 2022-05-03 DIAGNOSIS — M25562 Pain in left knee: Secondary | ICD-10-CM | POA: Diagnosis not present

## 2022-05-03 DIAGNOSIS — M170 Bilateral primary osteoarthritis of knee: Secondary | ICD-10-CM | POA: Diagnosis not present

## 2022-05-31 DIAGNOSIS — M1711 Unilateral primary osteoarthritis, right knee: Secondary | ICD-10-CM | POA: Diagnosis not present

## 2022-06-08 DIAGNOSIS — M1712 Unilateral primary osteoarthritis, left knee: Secondary | ICD-10-CM | POA: Diagnosis not present

## 2022-06-15 DIAGNOSIS — M1712 Unilateral primary osteoarthritis, left knee: Secondary | ICD-10-CM | POA: Diagnosis not present

## 2022-06-22 DIAGNOSIS — M1712 Unilateral primary osteoarthritis, left knee: Secondary | ICD-10-CM | POA: Diagnosis not present

## 2022-08-26 ENCOUNTER — Encounter: Payer: Self-pay | Admitting: Family Medicine

## 2022-08-26 DIAGNOSIS — R600 Localized edema: Secondary | ICD-10-CM | POA: Diagnosis not present

## 2022-08-26 DIAGNOSIS — M25562 Pain in left knee: Secondary | ICD-10-CM | POA: Diagnosis not present

## 2022-08-26 DIAGNOSIS — M7122 Synovial cyst of popliteal space [Baker], left knee: Secondary | ICD-10-CM | POA: Diagnosis not present

## 2022-08-26 DIAGNOSIS — M1712 Unilateral primary osteoarthritis, left knee: Secondary | ICD-10-CM | POA: Diagnosis not present

## 2022-08-26 DIAGNOSIS — S83232A Complex tear of medial meniscus, current injury, left knee, initial encounter: Secondary | ICD-10-CM | POA: Diagnosis not present

## 2022-08-30 DIAGNOSIS — M25561 Pain in right knee: Secondary | ICD-10-CM | POA: Diagnosis not present

## 2022-08-30 DIAGNOSIS — M25562 Pain in left knee: Secondary | ICD-10-CM | POA: Diagnosis not present

## 2022-09-01 ENCOUNTER — Ambulatory Visit: Payer: BC Managed Care – PPO | Admitting: Nurse Practitioner

## 2022-09-14 ENCOUNTER — Telehealth: Payer: Self-pay

## 2022-09-14 NOTE — Telephone Encounter (Signed)
Patient unable to make 9:20 am on 8/15 due to another appointment @ 8:30 am that morning. I offered her an appointment on September 10th but she stated she needed to be worked in sooner. Please Advise.

## 2022-09-14 NOTE — Telephone Encounter (Signed)
Provider schedule is tight, please place on cancellation list.

## 2022-09-14 NOTE — Telephone Encounter (Signed)
Pt is scheduled for 10/05/22. They are asking to come in sooner if possible. Surgery cannot be scheduled until the clearance is done.  Also asking if they can have labs done prior to their appt. Please advise.

## 2022-09-14 NOTE — Telephone Encounter (Signed)
Please call and schedule pt for OV for Surgery Clearance. Pt last visit here was 05/25/21.

## 2022-09-15 ENCOUNTER — Ambulatory Visit: Payer: BC Managed Care – PPO | Admitting: Family Medicine

## 2022-09-15 NOTE — Telephone Encounter (Signed)
Appears scheduled for tomorrow

## 2022-09-15 NOTE — Telephone Encounter (Signed)
See below

## 2022-09-16 ENCOUNTER — Encounter: Payer: Self-pay | Admitting: Family Medicine

## 2022-09-16 ENCOUNTER — Ambulatory Visit: Payer: BC Managed Care – PPO | Admitting: Family Medicine

## 2022-09-16 VITALS — BP 120/64 | HR 94 | Temp 97.9°F | Ht 67.0 in | Wt 171.6 lb

## 2022-09-16 DIAGNOSIS — Z13220 Encounter for screening for lipoid disorders: Secondary | ICD-10-CM

## 2022-09-16 DIAGNOSIS — I7121 Aneurysm of the ascending aorta, without rupture: Secondary | ICD-10-CM | POA: Diagnosis not present

## 2022-09-16 DIAGNOSIS — R739 Hyperglycemia, unspecified: Secondary | ICD-10-CM | POA: Diagnosis not present

## 2022-09-16 DIAGNOSIS — D539 Nutritional anemia, unspecified: Secondary | ICD-10-CM

## 2022-09-16 DIAGNOSIS — Z131 Encounter for screening for diabetes mellitus: Secondary | ICD-10-CM

## 2022-09-16 NOTE — Progress Notes (Signed)
Phone [phone] In person visit   Subjective:   Jacqueline Molina is a 62 y.o. year old very pleasant female patient who presents for/with See problem oriented charting Chief Complaint  Patient presents with   surgery clearance    Pt needs paperwork filled out for upcoming Right Total Knee Replacement   Past Medical History-  Patient Active Problem List   Diagnosis Date Noted   Ascending aortic aneurysm (HCC) 07/07/2021    Priority: High   Skin cancer 04/22/2015    Priority: Medium    GERD 06/11/2007    Priority: Medium    Macrocytic anemia 12/30/2008    Priority: Low   Elevated blood pressure 06/11/2007    Priority: Low   Chronic pain of left wrist 06/28/2018   Fracture of radial head, right, closed 06/28/2018   GI bleed 12/30/2016   BRBPR (bright red blood per rectum) 12/29/2016    Medications- reviewed and updated Current Outpatient Medications  Medication Sig Dispense Refill   Multiple Vitamins-Minerals (MULTIVITAMIN WITH MINERALS) tablet Take 1 tablet by mouth daily.     pantoprazole (PROTONIX) 40 MG tablet Take 1 tablet (40 mg total) by mouth daily. 30 tablet 5   No current facility-administered medications for this visit.     Objective:  BP 120/64   Pulse 94   Temp 97.9 F (36.6 C)   Ht 5\' 7"  (1.702 m)   Wt 171 lb 9.6 oz (77.8 kg)   SpO2 97%   BMI 26.88 kg/m  Gen: NAD, resting comfortably CV: RRR no murmurs rubs or gallops Lungs: CTAB no crackles, wheeze, rhonchi Abdomen: soft/nontender/nondistended/normal bowel sounds.  Ext: no edema Skin: warm, dry     Assessment and Plan   # Right total knee replacement S:has been working with [facility] Dr. Blanchie Dessert- she had an injection in January and then subsequent injection recently- earliest available date will be October 30th- planning to schedule Nov 7th.  -she is also hoping to do left knee replacement before year end if possible.  -can easily go up 2 flights of stairs from respiratory and  cardiac perpsective without chest pain or shortness of breath . Does not experience palpitations A/P: Patient is medically maximized as long as labs are stable-we will update labs as below and send form back to [employer] once these are completed-she plans to come back for fasting labs next week  # ascending aortic aneurysm S:incidental finding of ascending aortic aneurysm of 4.2 cm on 07/06/21  A/P: Discussed diagnosis once again.  We will also obtain annual follow-up imaging at this time with CT angiogram -Offered referral to vascular surgeon but she wants to hold off for now unless worsens - should avoid quinolones  #hyperlipidemia S: Medication:none -no heart attacks or strokes in family - mom had a fib but no CAD Lab Results  Component Value Date   CHOL 203 (H) 05/25/2021   HDL 103.70 05/25/2021   LDLCALC 86 05/25/2021   LDLDIRECT 113.5 10/05/2007   TRIG 65.0 05/25/2021   CHOLHDL 2 05/25/2021   A/P: Mild elevations but not in range for medication  # Erosive gastritis S:medication: pantoprazole 40 mg -takes 2 advil once a week  A/P: Reasonable control but advised against Advil as could increase gastritis risk but she feels benefits may outweigh the risks  # Hyperglycemia/insulin resistance/prediabetes S:  Medication: None Exercise and diet-remains active with pickleball despite her knee Lab Results  Component Value Date   HGBA1C 5.9 05/25/2021   HGBA1C 5.8 10/18/2018  HGBA1C 5.7 11/23/2016   A/P: Prediabetes was slightly worsening on last check-update with labs today  Recommended follow up: Return in about 1 year (around 09/16/2023) for physical or sooner if needed.Schedule b4 you leave. Future Appointments  Date Time Provider Department Center  09/19/2022  9:15 AM [facility]  10/12/2022  3:00 PM Meredith Pel, NP [facility]  10/14/2022 12:00 PM Chrzanowski, Lamona Curl, NP [facility] None  09/20/2023  9:00 AM Durene Cal Aldine Contes, MD [facility]     Lab/Order associations:   ICD-10-CM   1. Macrocytic anemia  D53.9 CBC with Differential/Platelet    CANCELED: CBC with Differential/Platelet    2. Aneurysm of ascending aorta without rupture (HCC)  I71.21 CT ANGIO CHEST AORTA W/CM & OR WO/CM    3. Hyperglycemia  R73.9 Hemoglobin A1c    CANCELED: Hemoglobin A1c    4. Screening for hyperlipidemia  Z13.220 Comprehensive metabolic panel    Lipid panel    CANCELED: Comprehensive metabolic panel    CANCELED: Lipid panel    5. Screening for diabetes mellitus  Z13.1 Hemoglobin A1c    CANCELED: Hemoglobin A1c      Time Spent: 23 minutes of total time was spent on the date of the encounter performing the following actions: chart review prior to seeing the patient, obtaining history, performing a medically necessary exam, counseling on the treatment plan including about risks of aortic aneurysm and avoiding quinolones, placing orders, and documenting in our EHR.    Return precautions advised.  Tana Conch, MD

## 2022-09-16 NOTE — Patient Instructions (Addendum)
Let us know if you get a flu, COVID or SHINGRIX vaccine this fall.  Schedule a lab visit at the check out desk within 2 weeks. Return for future fasting labs meaning nothing but water after midnight please. Ok to take your medications with water.   We will call you within two weeks about your referral to CT angiogram for aortic aneurysm follow up  through [facility].  Their phone number is [phone].  Please call them if you have not heard in 1-2 weeks -prefer blood pressure <120 with aortic aneurysm  Recommended follow up: Return in about 1 year (around 09/16/2023) for physical or sooner if needed.Schedule b4 you leave.

## 2022-09-19 ENCOUNTER — Other Ambulatory Visit (INDEPENDENT_AMBULATORY_CARE_PROVIDER_SITE_OTHER): Payer: BC Managed Care – PPO

## 2022-09-19 DIAGNOSIS — Z131 Encounter for screening for diabetes mellitus: Secondary | ICD-10-CM

## 2022-09-19 DIAGNOSIS — Z13220 Encounter for screening for lipoid disorders: Secondary | ICD-10-CM

## 2022-09-19 DIAGNOSIS — D539 Nutritional anemia, unspecified: Secondary | ICD-10-CM

## 2022-09-19 DIAGNOSIS — R739 Hyperglycemia, unspecified: Secondary | ICD-10-CM | POA: Diagnosis not present

## 2022-09-19 LAB — CBC WITH DIFFERENTIAL/PLATELET
Basophils Absolute: 0 10*3/uL (ref 0.0–0.1)
Basophils Relative: 0.9 % (ref 0.0–3.0)
Eosinophils Absolute: 0 10*3/uL (ref 0.0–0.7)
Eosinophils Relative: 1.2 % (ref 0.0–5.0)
HCT: 42.1 % (ref 36.0–46.0)
Hemoglobin: 13.8 g/dL (ref 12.0–15.0)
Lymphocytes Relative: 22.8 % (ref 12.0–46.0)
Lymphs Abs: 0.9 10*3/uL (ref 0.7–4.0)
MCHC: 32.9 g/dL (ref 30.0–36.0)
MCV: 98.8 fl (ref 78.0–100.0)
Monocytes Absolute: 0.3 10*3/uL (ref 0.1–1.0)
Monocytes Relative: 7 % (ref 3.0–12.0)
Neutro Abs: 2.8 10*3/uL (ref 1.4–7.7)
Neutrophils Relative %: 68.1 % (ref 43.0–77.0)
Platelets: 222 10*3/uL (ref 150.0–400.0)
RBC: 4.26 Mil/uL (ref 3.87–5.11)
RDW: 12.9 % (ref 11.5–15.5)
WBC: 4.1 10*3/uL (ref 4.0–10.5)

## 2022-09-19 LAB — LIPID PANEL
Cholesterol: 222 mg/dL — ABNORMAL HIGH (ref 0–200)
HDL: 92.9 mg/dL (ref >39.00)
LDL Cholesterol: 119 mg/dL — ABNORMAL HIGH (ref 0–99)
NonHDL: 128.86
Total CHOL/HDL Ratio: 2
Triglycerides: 51 mg/dL (ref 0.0–149.0)
VLDL: 10.2 mg/dL (ref 0.0–40.0)

## 2022-09-19 LAB — COMPREHENSIVE METABOLIC PANEL
ALT: 18 U/L (ref 0–35)
AST: 21 U/L (ref 0–37)
Albumin: 4.6 g/dL (ref 3.5–5.2)
Alkaline Phosphatase: 75 U/L (ref 39–117)
BUN: 15 mg/dL (ref 6–23)
CO2: 28 mEq/L (ref 19–32)
Calcium: 9.8 mg/dL (ref 8.4–10.5)
Chloride: 103 mEq/L (ref 96–112)
Creatinine, Ser: 0.73 mg/dL (ref 0.40–1.20)
GFR: 87.81 mL/min (ref >60.00)
Glucose, Bld: 116 mg/dL — ABNORMAL HIGH (ref 70–99)
Potassium: 4.7 mEq/L (ref 3.5–5.1)
Sodium: 140 mEq/L (ref 135–145)
Total Bilirubin: 0.9 mg/dL (ref 0.2–1.2)
Total Protein: 7.2 g/dL (ref 6.0–8.3)

## 2022-09-19 LAB — HEMOGLOBIN A1C: Hgb A1c MFr Bld: 5.7 % (ref 4.6–6.5)

## 2022-10-05 ENCOUNTER — Ambulatory Visit: Payer: BC Managed Care – PPO | Admitting: Family Medicine

## 2022-10-10 ENCOUNTER — Ambulatory Visit
Admission: RE | Admit: 2022-10-10 | Discharge: 2022-10-10 | Disposition: A | Discharge status: Home / Self Care | Payer: BC Managed Care – PPO | Source: Ambulatory Visit | Attending: Family Medicine | Admitting: Family Medicine

## 2022-10-10 DIAGNOSIS — I7121 Aneurysm of the ascending aorta, without rupture: Secondary | ICD-10-CM

## 2022-10-10 DIAGNOSIS — I719 Aortic aneurysm of unspecified site, without rupture: Secondary | ICD-10-CM | POA: Diagnosis not present

## 2022-10-10 MED ORDER — IOPAMIDOL (ISOVUE-370) INJECTION 76%
200.0000 mL | Freq: Once | INTRAVENOUS | Status: AC | PRN
  Administered 2022-10-10: 200 mL via INTRAVENOUS

## 2022-10-11 ENCOUNTER — Ambulatory Visit: Payer: BC Managed Care – PPO | Admitting: Nurse Practitioner

## 2022-10-12 ENCOUNTER — Encounter: Payer: Self-pay | Admitting: Gastroenterology

## 2022-10-12 ENCOUNTER — Ambulatory Visit (INDEPENDENT_AMBULATORY_CARE_PROVIDER_SITE_OTHER): Payer: BC Managed Care – PPO | Admitting: Gastroenterology

## 2022-10-12 VITALS — BP 132/78 | HR 86 | Ht 67.0 in | Wt 173.0 lb

## 2022-10-12 DIAGNOSIS — K3189 Other diseases of stomach and duodenum: Secondary | ICD-10-CM | POA: Diagnosis not present

## 2022-10-12 NOTE — Progress Notes (Signed)
Agree with assessment and plan as outlined.  
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
## 2022-10-13 ENCOUNTER — Ambulatory Visit: Payer: BC Managed Care – PPO | Admitting: Obstetrics & Gynecology

## 2022-10-14 ENCOUNTER — Encounter: Payer: Self-pay | Admitting: Family Medicine

## 2022-10-14 ENCOUNTER — Ambulatory Visit (INDEPENDENT_AMBULATORY_CARE_PROVIDER_SITE_OTHER): Payer: BC Managed Care – PPO | Admitting: Radiology

## 2022-10-14 ENCOUNTER — Encounter: Payer: Self-pay | Admitting: Radiology

## 2022-10-14 VITALS — BP 120/82 | HR 78 | Ht 65.35 in | Wt 173.2 lb

## 2022-10-14 DIAGNOSIS — Z780 Asymptomatic menopausal state: Secondary | ICD-10-CM | POA: Diagnosis not present

## 2022-10-14 DIAGNOSIS — Z01419 Encounter for gynecological examination (general) (routine) without abnormal findings: Secondary | ICD-10-CM | POA: Diagnosis not present

## 2022-10-14 NOTE — Progress Notes (Signed)
Jacqueline Molina May 17, 1960 [id number]   History: Postmenopausal 62 y.o. presents for annual exam. No gyn concerns. Having a knee replacement soon. 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   Gynecologic History Postmenopausal Last Pap: 10/11/21. Results were: normal Last mammogram: 03/17/22. Results were: normal Last colonoscopy: 02/08/22 DEXA:2019 normal   Obstetric History OB History  Gravida Para Term Preterm AB Living  1 1       1   SAB IAB Ectopic Multiple Live Births               # Outcome Date GA Lbr Len/2nd Weight Sex Type Anes PTL Lv  1 Para              The following portions of the patient's history were reviewed and updated as appropriate: allergies, current medications, past family history, past medical history, past social history, past surgical history, and problem list.  Review of Systems Pertinent items noted in HPI and remainder of comprehensive ROS otherwise negative.  Past medical history, past surgical history, family history and social history were all reviewed and documented in the EPIC chart.  Exam:  Vitals:   10/14/22 1201  BP: 120/82  Pulse: 78  SpO2: 100%  Weight: 173 lb 3.2 oz (78.6 kg)  Height: 5' 5.35" (1.66 m)   Body mass index is 28.51 kg/m.  General appearance:  Normal Thyroid:  Symmetrical, normal in size, without palpable masses or nodularity. Respiratory  Auscultation:  Clear without wheezing or rhonchi Cardiovascular  Auscultation:  Regular rate, without rubs, murmurs or gallops  Edema/varicosities:  Not grossly evident Abdominal  Soft,nontender, without masses, guarding or rebound.  Liver/spleen:  No organomegaly noted  Hernia:  None appreciated  Skin  Inspection:  Grossly normal Breasts: Examined lying and sitting.   Right: Without masses, retractions, nipple discharge or axillary adenopathy.   Left: Without masses, retractions, nipple discharge or axillary  adenopathy. Genitourinary   Inguinal/mons:  Normal without inguinal adenopathy  External genitalia:  Normal appearing vulva with no masses, tenderness, or lesions  BUS/Urethra/Skene's glands:  Normal  Vagina:  Normal appearing with normal color and discharge, no lesions. Atrophy: mild   Cervix:  Normal appearing without discharge or lesions  Uterus:  Normal in size, shape and contour.  Midline and mobile, nontender  Adnexa/parametria:     Rt: Normal in size, without masses or tenderness.   Lt: Normal in size, without masses or tenderness.  Anus and perineum: Normal    Carolynn Serve, CMA present for exam  Assessment/Plan:   1. Well woman exam with routine gynecological exam Pap 2026  2. Post-menopausal - DG Bone Density; Future    Discussed SBE, colonoscopy and DEXA screening as directed. Recommend of exercise weekly, including weight bearing exercise. Encouraged the use of seatbelts and sunscreen.  Return in 1 year for annual or sooner prn.  Arlie Solomons B WHNP-BC, 12:15 PM 10/14/2022

## 2022-10-17 MED ORDER — DOXYCYCLINE HYCLATE 100 MG PO TABS: 100.0000 mg | ORAL_TABLET | Freq: Two times a day (BID) | ORAL | 0 refills | Status: AC

## 2022-11-17 DIAGNOSIS — M25561 Pain in right knee: Secondary | ICD-10-CM | POA: Diagnosis not present

## 2022-11-27 ENCOUNTER — Other Ambulatory Visit: Payer: Self-pay | Admitting: Nurse Practitioner

## 2022-11-30 DIAGNOSIS — M6281 Muscle weakness (generalized): Secondary | ICD-10-CM | POA: Diagnosis not present

## 2022-11-30 DIAGNOSIS — M25661 Stiffness of right knee, not elsewhere classified: Secondary | ICD-10-CM | POA: Diagnosis not present

## 2022-11-30 DIAGNOSIS — M1711 Unilateral primary osteoarthritis, right knee: Secondary | ICD-10-CM | POA: Diagnosis not present

## 2022-12-08 ENCOUNTER — Encounter: Payer: Self-pay | Admitting: Family Medicine

## 2022-12-09 DIAGNOSIS — M1711 Unilateral primary osteoarthritis, right knee: Secondary | ICD-10-CM | POA: Diagnosis not present

## 2022-12-09 HISTORY — PX: REPLACEMENT TOTAL KNEE: SUR1224

## 2023-01-06 DIAGNOSIS — M25562 Pain in left knee: Secondary | ICD-10-CM | POA: Diagnosis not present

## 2023-01-06 DIAGNOSIS — M1712 Unilateral primary osteoarthritis, left knee: Secondary | ICD-10-CM | POA: Diagnosis not present

## 2023-01-06 DIAGNOSIS — R7303 Prediabetes: Secondary | ICD-10-CM | POA: Diagnosis not present

## 2023-01-10 DIAGNOSIS — M1712 Unilateral primary osteoarthritis, left knee: Secondary | ICD-10-CM | POA: Diagnosis not present

## 2023-01-27 DIAGNOSIS — M25762 Osteophyte, left knee: Secondary | ICD-10-CM | POA: Diagnosis not present

## 2023-01-27 DIAGNOSIS — M1712 Unilateral primary osteoarthritis, left knee: Secondary | ICD-10-CM | POA: Diagnosis not present

## 2023-01-27 HISTORY — PX: REPLACEMENT TOTAL KNEE: SUR1224

## 2023-02-07 ENCOUNTER — Ambulatory Visit (HOSPITAL_COMMUNITY)
Admission: RE | Admit: 2023-02-07 | Discharge: 2023-02-07 | Disposition: A | Discharge status: Home / Self Care | Payer: BC Managed Care – PPO | Source: Ambulatory Visit | Attending: Internal Medicine | Admitting: Internal Medicine

## 2023-02-07 ENCOUNTER — Other Ambulatory Visit (HOSPITAL_COMMUNITY): Payer: Self-pay | Admitting: Orthopedic Surgery

## 2023-02-07 DIAGNOSIS — M79662 Pain in left lower leg: Secondary | ICD-10-CM | POA: Insufficient documentation

## 2023-02-07 DIAGNOSIS — M7989 Other specified soft tissue disorders: Secondary | ICD-10-CM | POA: Diagnosis not present

## 2023-02-16 DIAGNOSIS — M7122 Synovial cyst of popliteal space [Baker], left knee: Secondary | ICD-10-CM | POA: Diagnosis not present

## 2023-03-06 DIAGNOSIS — Z85828 Personal history of other malignant neoplasm of skin: Secondary | ICD-10-CM | POA: Diagnosis not present

## 2023-03-06 DIAGNOSIS — D2262 Melanocytic nevi of left upper limb, including shoulder: Secondary | ICD-10-CM | POA: Diagnosis not present

## 2023-03-06 DIAGNOSIS — L821 Other seborrheic keratosis: Secondary | ICD-10-CM | POA: Diagnosis not present

## 2023-03-06 DIAGNOSIS — L812 Freckles: Secondary | ICD-10-CM | POA: Diagnosis not present

## 2023-03-06 DIAGNOSIS — L820 Inflamed seborrheic keratosis: Secondary | ICD-10-CM | POA: Diagnosis not present

## 2023-03-15 ENCOUNTER — Other Ambulatory Visit: Payer: Self-pay | Admitting: Family Medicine

## 2023-03-15 DIAGNOSIS — Z1231 Encounter for screening mammogram for malignant neoplasm of breast: Secondary | ICD-10-CM

## 2023-04-05 ENCOUNTER — Ambulatory Visit
Admission: RE | Admit: 2023-04-05 | Discharge: 2023-04-05 | Disposition: A | Discharge status: Home / Self Care | Payer: BC Managed Care – PPO | Source: Ambulatory Visit | Attending: Family Medicine | Admitting: Family Medicine

## 2023-04-05 DIAGNOSIS — Z1231 Encounter for screening mammogram for malignant neoplasm of breast: Secondary | ICD-10-CM

## 2023-04-10 ENCOUNTER — Encounter: Payer: Self-pay | Admitting: Obstetrics and Gynecology

## 2023-05-11 ENCOUNTER — Other Ambulatory Visit: Payer: Self-pay | Admitting: Nurse Practitioner

## 2023-09-20 ENCOUNTER — Encounter: Payer: BC Managed Care – PPO | Admitting: Family Medicine

## 2023-10-17 ENCOUNTER — Ambulatory Visit: Payer: BC Managed Care – PPO | Admitting: Obstetrics and Gynecology

## 2023-11-02 ENCOUNTER — Encounter: Payer: Self-pay | Admitting: Radiology

## 2023-11-02 ENCOUNTER — Ambulatory Visit: Admitting: Radiology

## 2023-11-02 VITALS — BP 112/70 | HR 97 | Ht 65.5 in | Wt 179.0 lb

## 2023-11-02 DIAGNOSIS — Z780 Asymptomatic menopausal state: Secondary | ICD-10-CM | POA: Diagnosis not present

## 2023-11-02 DIAGNOSIS — Z1331 Encounter for screening for depression: Secondary | ICD-10-CM

## 2023-11-02 DIAGNOSIS — Z01419 Encounter for gynecological examination (general) (routine) without abnormal findings: Secondary | ICD-10-CM

## 2023-11-02 NOTE — Progress Notes (Signed)
 Jacqueline Molina September 13, 1960 [id number]   History: Postmenopausal 63 y.o. presents for annual exam. No gyn concerns. 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 Doing great, back to playing pickleball.   Gynecologic History Postmenopausal Last Pap: 10/11/21. Results were: normal Last mammogram: 03/17/22. Results were: normal Last colonoscopy: 02/08/22 DEXA:2019 normal   Obstetric History OB History  Gravida Para Term Preterm AB Living  1 1    1   SAB IAB Ectopic Multiple Live Births          # Outcome Date GA Lbr Len/2nd Weight Sex Type Anes PTL Lv  1 Para               11/02/2023    3:10 PM 09/16/2022    3:34 PM 05/25/2021    9:53 AM 10/18/2018    8:09 AM 11/23/2016   11:30 AM  Depression screen PHQ 2/9  Decreased Interest 0 0 0 0 0  Down, Depressed, Hopeless 0 0 0 0 0  PHQ - 2 Score 0 0 0 0 0  Altered sleeping  0 0    Tired, decreased energy  0 0    Change in appetite  0 0    Feeling bad or failure about yourself   0 0    Trouble concentrating  0 0    Moving slowly or fidgety/restless  0 0    Suicidal thoughts  0 0    PHQ-9 Score  0 0    Difficult doing work/chores  Not difficult at all Not difficult at all       The following portions of the patient's history were reviewed and updated as appropriate: allergies, current medications, past family history, past medical history, past social history, past surgical history, and problem list.  Review of Systems Pertinent items noted in HPI and remainder of comprehensive ROS otherwise negative.  Past medical history, past surgical history, family history and social history were all reviewed and documented in the EPIC chart.  Exam:  Vitals:   11/02/23 1510  BP: 112/70  Pulse: 97  SpO2: 97%  Weight: 179 lb (81.2 kg)  Height: 5' 5.5 (1.664 m)   Body mass index is 29.33 kg/m.  General appearance:  Normal Thyroid :  Symmetrical, normal in size, without palpable masses or nodularity. Respiratory  Auscultation:  Clear  without wheezing or rhonchi Cardiovascular  Auscultation:  Regular rate, without rubs, murmurs or gallops  Edema/varicosities:  Not grossly evident Abdominal  Soft,nontender, without masses, guarding or rebound.  Liver/spleen:  No organomegaly noted  Hernia:  None appreciated  Skin  Inspection:  Grossly normal Breasts: Examined lying and sitting.   Right: Without masses, retractions, nipple discharge or axillary adenopathy.   Left: Without masses, retractions, nipple discharge or axillary adenopathy. Genitourinary   Inguinal/mons:  Normal without inguinal adenopathy  External genitalia:  Normal appearing vulva with no masses, tenderness, or lesions  BUS/Urethra/Skene's glands:  Normal  Vagina:  Normal appearing with normal color and discharge, no lesions. Atrophy: mild   Cervix:  Normal appearing without discharge or lesions  Uterus:  Normal in size, shape and contour.  Midline and mobile, nontender  Adnexa/parametria:     Rt: Normal in size, without masses or tenderness.   Lt: Normal in size, without masses or tenderness.  Anus and perineum: Normal    Darice Hoit, CMA present for exam  Assessment/Plan:   1. Well woman exam with routine gynecological exam (Primary) Pap 2026  2. Post-menopausal - DG Bone Density; Future  3. Depression screen   Tika Hannis B WHNP-BC, 3:52 PM 11/02/2023

## 2023-11-05 ENCOUNTER — Other Ambulatory Visit: Payer: Self-pay | Admitting: Gastroenterology

## 2023-12-06 ENCOUNTER — Encounter: Admitting: Family Medicine

## 2023-12-31 ENCOUNTER — Other Ambulatory Visit: Payer: Self-pay | Admitting: Gastroenterology

## 2024-04-25 ENCOUNTER — Encounter: Admitting: Family Medicine

## 2024-05-07 ENCOUNTER — Other Ambulatory Visit (HOSPITAL_BASED_OUTPATIENT_CLINIC_OR_DEPARTMENT_OTHER)
